# Patient Record
Sex: Female | Born: 1935 | Race: White | Hispanic: No | Smoking: Never smoker
Health system: Southern US, Community
[De-identification: ages and names within clinical notes are randomized; demographics above are authoritative.]

## PROBLEM LIST (undated history)

## (undated) DIAGNOSIS — J189 Pneumonia, unspecified organism: Secondary | ICD-10-CM

## (undated) DIAGNOSIS — Z85828 Personal history of other malignant neoplasm of skin: Secondary | ICD-10-CM

## (undated) DIAGNOSIS — Z789 Other specified health status: Secondary | ICD-10-CM

## (undated) DIAGNOSIS — R42 Dizziness and giddiness: Secondary | ICD-10-CM

## (undated) DIAGNOSIS — G589 Mononeuropathy, unspecified: Secondary | ICD-10-CM

## (undated) HISTORY — PX: VARICOSE VEIN SURGERY: SHX832

## (undated) HISTORY — PX: TONSILLECTOMY: SUR1361

## (undated) HISTORY — PX: BREAST LUMPECTOMY: SHX2

## (undated) HISTORY — PX: FINGER SURGERY: SHX640

## (undated) HISTORY — DX: Pneumonia, unspecified organism: J18.9

## (undated) HISTORY — PX: SKIN CANCER EXCISION: SHX779

## (undated) HISTORY — PX: BREAST SURGERY: SHX581

## (undated) HISTORY — PX: TOE SURGERY: SHX1073

## (undated) HISTORY — DX: Dizziness and giddiness: R42

## (undated) HISTORY — PX: CHOLECYSTECTOMY: SHX55

## (undated) HISTORY — PX: OTHER SURGICAL HISTORY: SHX169

## (undated) HISTORY — PX: CATARACT EXTRACTION, BILATERAL: SHX1313

## (undated) HISTORY — DX: Mononeuropathy, unspecified: G58.9

## (undated) HISTORY — PX: HAND SURGERY: SHX662

## (undated) HISTORY — DX: Other specified health status: Z78.9

---

## 2011-02-17 DIAGNOSIS — Z09 Encounter for follow-up examination after completed treatment for conditions other than malignant neoplasm: Secondary | ICD-10-CM | POA: Diagnosis not present

## 2011-02-17 DIAGNOSIS — H5210 Myopia, unspecified eye: Secondary | ICD-10-CM | POA: Diagnosis not present

## 2011-04-28 DIAGNOSIS — M999 Biomechanical lesion, unspecified: Secondary | ICD-10-CM | POA: Diagnosis not present

## 2011-05-02 DIAGNOSIS — M999 Biomechanical lesion, unspecified: Secondary | ICD-10-CM | POA: Diagnosis not present

## 2011-05-04 DIAGNOSIS — M999 Biomechanical lesion, unspecified: Secondary | ICD-10-CM | POA: Diagnosis not present

## 2011-05-05 DIAGNOSIS — M999 Biomechanical lesion, unspecified: Secondary | ICD-10-CM | POA: Diagnosis not present

## 2011-05-09 DIAGNOSIS — M999 Biomechanical lesion, unspecified: Secondary | ICD-10-CM | POA: Diagnosis not present

## 2011-05-11 DIAGNOSIS — M999 Biomechanical lesion, unspecified: Secondary | ICD-10-CM | POA: Diagnosis not present

## 2011-05-12 DIAGNOSIS — M999 Biomechanical lesion, unspecified: Secondary | ICD-10-CM | POA: Diagnosis not present

## 2011-05-16 DIAGNOSIS — M999 Biomechanical lesion, unspecified: Secondary | ICD-10-CM | POA: Diagnosis not present

## 2011-05-19 DIAGNOSIS — M999 Biomechanical lesion, unspecified: Secondary | ICD-10-CM | POA: Diagnosis not present

## 2011-05-23 DIAGNOSIS — M999 Biomechanical lesion, unspecified: Secondary | ICD-10-CM | POA: Diagnosis not present

## 2011-05-26 DIAGNOSIS — M999 Biomechanical lesion, unspecified: Secondary | ICD-10-CM | POA: Diagnosis not present

## 2011-05-31 DIAGNOSIS — M999 Biomechanical lesion, unspecified: Secondary | ICD-10-CM | POA: Diagnosis not present

## 2011-06-07 DIAGNOSIS — E782 Mixed hyperlipidemia: Secondary | ICD-10-CM | POA: Diagnosis not present

## 2011-06-07 DIAGNOSIS — Z131 Encounter for screening for diabetes mellitus: Secondary | ICD-10-CM | POA: Diagnosis not present

## 2011-06-07 DIAGNOSIS — E039 Hypothyroidism, unspecified: Secondary | ICD-10-CM | POA: Diagnosis not present

## 2011-06-07 DIAGNOSIS — Z79899 Other long term (current) drug therapy: Secondary | ICD-10-CM | POA: Diagnosis not present

## 2011-06-07 DIAGNOSIS — R634 Abnormal weight loss: Secondary | ICD-10-CM | POA: Diagnosis not present

## 2011-06-07 DIAGNOSIS — M810 Age-related osteoporosis without current pathological fracture: Secondary | ICD-10-CM | POA: Diagnosis not present

## 2011-06-14 DIAGNOSIS — M999 Biomechanical lesion, unspecified: Secondary | ICD-10-CM | POA: Diagnosis not present

## 2011-06-28 DIAGNOSIS — M999 Biomechanical lesion, unspecified: Secondary | ICD-10-CM | POA: Diagnosis not present

## 2011-07-19 DIAGNOSIS — M999 Biomechanical lesion, unspecified: Secondary | ICD-10-CM | POA: Diagnosis not present

## 2011-08-02 DIAGNOSIS — Z1231 Encounter for screening mammogram for malignant neoplasm of breast: Secondary | ICD-10-CM | POA: Diagnosis not present

## 2011-08-18 DIAGNOSIS — H26499 Other secondary cataract, unspecified eye: Secondary | ICD-10-CM | POA: Diagnosis not present

## 2011-09-10 DIAGNOSIS — L259 Unspecified contact dermatitis, unspecified cause: Secondary | ICD-10-CM | POA: Diagnosis not present

## 2011-09-10 DIAGNOSIS — W57XXXA Bitten or stung by nonvenomous insect and other nonvenomous arthropods, initial encounter: Secondary | ICD-10-CM | POA: Diagnosis not present

## 2011-09-10 DIAGNOSIS — T148 Other injury of unspecified body region: Secondary | ICD-10-CM | POA: Diagnosis not present

## 2011-10-10 DIAGNOSIS — L821 Other seborrheic keratosis: Secondary | ICD-10-CM | POA: Diagnosis not present

## 2011-10-10 DIAGNOSIS — L259 Unspecified contact dermatitis, unspecified cause: Secondary | ICD-10-CM | POA: Diagnosis not present

## 2011-10-27 DIAGNOSIS — S0003XA Contusion of scalp, initial encounter: Secondary | ICD-10-CM | POA: Diagnosis not present

## 2011-10-27 DIAGNOSIS — R51 Headache: Secondary | ICD-10-CM | POA: Diagnosis not present

## 2011-10-27 DIAGNOSIS — W1809XA Striking against other object with subsequent fall, initial encounter: Secondary | ICD-10-CM | POA: Diagnosis not present

## 2011-10-27 DIAGNOSIS — S1093XA Contusion of unspecified part of neck, initial encounter: Secondary | ICD-10-CM | POA: Diagnosis not present

## 2011-10-27 DIAGNOSIS — S0083XA Contusion of other part of head, initial encounter: Secondary | ICD-10-CM | POA: Diagnosis not present

## 2011-11-01 DIAGNOSIS — R634 Abnormal weight loss: Secondary | ICD-10-CM | POA: Diagnosis not present

## 2011-11-01 DIAGNOSIS — Z23 Encounter for immunization: Secondary | ICD-10-CM | POA: Diagnosis not present

## 2011-11-01 DIAGNOSIS — S0083XA Contusion of other part of head, initial encounter: Secondary | ICD-10-CM | POA: Diagnosis not present

## 2011-11-01 DIAGNOSIS — S1093XA Contusion of unspecified part of neck, initial encounter: Secondary | ICD-10-CM | POA: Diagnosis not present

## 2011-11-01 DIAGNOSIS — M9981 Other biomechanical lesions of cervical region: Secondary | ICD-10-CM | POA: Diagnosis not present

## 2011-11-01 DIAGNOSIS — S139XXA Sprain of joints and ligaments of unspecified parts of neck, initial encounter: Secondary | ICD-10-CM | POA: Diagnosis not present

## 2011-11-01 DIAGNOSIS — M999 Biomechanical lesion, unspecified: Secondary | ICD-10-CM | POA: Diagnosis not present

## 2011-11-07 DIAGNOSIS — M9981 Other biomechanical lesions of cervical region: Secondary | ICD-10-CM | POA: Diagnosis not present

## 2011-11-07 DIAGNOSIS — M999 Biomechanical lesion, unspecified: Secondary | ICD-10-CM | POA: Diagnosis not present

## 2011-11-07 DIAGNOSIS — S139XXA Sprain of joints and ligaments of unspecified parts of neck, initial encounter: Secondary | ICD-10-CM | POA: Diagnosis not present

## 2011-11-09 DIAGNOSIS — M9981 Other biomechanical lesions of cervical region: Secondary | ICD-10-CM | POA: Diagnosis not present

## 2011-11-09 DIAGNOSIS — M999 Biomechanical lesion, unspecified: Secondary | ICD-10-CM | POA: Diagnosis not present

## 2011-11-09 DIAGNOSIS — S139XXA Sprain of joints and ligaments of unspecified parts of neck, initial encounter: Secondary | ICD-10-CM | POA: Diagnosis not present

## 2011-11-10 DIAGNOSIS — S139XXA Sprain of joints and ligaments of unspecified parts of neck, initial encounter: Secondary | ICD-10-CM | POA: Diagnosis not present

## 2011-11-10 DIAGNOSIS — M999 Biomechanical lesion, unspecified: Secondary | ICD-10-CM | POA: Diagnosis not present

## 2011-11-10 DIAGNOSIS — M9981 Other biomechanical lesions of cervical region: Secondary | ICD-10-CM | POA: Diagnosis not present

## 2011-11-14 DIAGNOSIS — M9981 Other biomechanical lesions of cervical region: Secondary | ICD-10-CM | POA: Diagnosis not present

## 2011-11-14 DIAGNOSIS — M999 Biomechanical lesion, unspecified: Secondary | ICD-10-CM | POA: Diagnosis not present

## 2011-11-14 DIAGNOSIS — S139XXA Sprain of joints and ligaments of unspecified parts of neck, initial encounter: Secondary | ICD-10-CM | POA: Diagnosis not present

## 2011-11-15 DIAGNOSIS — M999 Biomechanical lesion, unspecified: Secondary | ICD-10-CM | POA: Diagnosis not present

## 2011-11-15 DIAGNOSIS — M9981 Other biomechanical lesions of cervical region: Secondary | ICD-10-CM | POA: Diagnosis not present

## 2011-11-15 DIAGNOSIS — S139XXA Sprain of joints and ligaments of unspecified parts of neck, initial encounter: Secondary | ICD-10-CM | POA: Diagnosis not present

## 2011-11-17 DIAGNOSIS — M999 Biomechanical lesion, unspecified: Secondary | ICD-10-CM | POA: Diagnosis not present

## 2011-11-17 DIAGNOSIS — M9981 Other biomechanical lesions of cervical region: Secondary | ICD-10-CM | POA: Diagnosis not present

## 2011-11-17 DIAGNOSIS — S139XXA Sprain of joints and ligaments of unspecified parts of neck, initial encounter: Secondary | ICD-10-CM | POA: Diagnosis not present

## 2011-11-21 DIAGNOSIS — M9981 Other biomechanical lesions of cervical region: Secondary | ICD-10-CM | POA: Diagnosis not present

## 2011-11-21 DIAGNOSIS — S139XXA Sprain of joints and ligaments of unspecified parts of neck, initial encounter: Secondary | ICD-10-CM | POA: Diagnosis not present

## 2011-11-21 DIAGNOSIS — M999 Biomechanical lesion, unspecified: Secondary | ICD-10-CM | POA: Diagnosis not present

## 2011-11-22 DIAGNOSIS — S139XXA Sprain of joints and ligaments of unspecified parts of neck, initial encounter: Secondary | ICD-10-CM | POA: Diagnosis not present

## 2011-11-22 DIAGNOSIS — M999 Biomechanical lesion, unspecified: Secondary | ICD-10-CM | POA: Diagnosis not present

## 2011-11-22 DIAGNOSIS — M9981 Other biomechanical lesions of cervical region: Secondary | ICD-10-CM | POA: Diagnosis not present

## 2011-11-24 DIAGNOSIS — M999 Biomechanical lesion, unspecified: Secondary | ICD-10-CM | POA: Diagnosis not present

## 2011-11-24 DIAGNOSIS — M9981 Other biomechanical lesions of cervical region: Secondary | ICD-10-CM | POA: Diagnosis not present

## 2011-11-24 DIAGNOSIS — S139XXA Sprain of joints and ligaments of unspecified parts of neck, initial encounter: Secondary | ICD-10-CM | POA: Diagnosis not present

## 2011-11-28 DIAGNOSIS — S139XXA Sprain of joints and ligaments of unspecified parts of neck, initial encounter: Secondary | ICD-10-CM | POA: Diagnosis not present

## 2011-11-28 DIAGNOSIS — M999 Biomechanical lesion, unspecified: Secondary | ICD-10-CM | POA: Diagnosis not present

## 2011-11-28 DIAGNOSIS — M9981 Other biomechanical lesions of cervical region: Secondary | ICD-10-CM | POA: Diagnosis not present

## 2011-12-01 DIAGNOSIS — M9981 Other biomechanical lesions of cervical region: Secondary | ICD-10-CM | POA: Diagnosis not present

## 2011-12-01 DIAGNOSIS — M999 Biomechanical lesion, unspecified: Secondary | ICD-10-CM | POA: Diagnosis not present

## 2011-12-01 DIAGNOSIS — S139XXA Sprain of joints and ligaments of unspecified parts of neck, initial encounter: Secondary | ICD-10-CM | POA: Diagnosis not present

## 2011-12-06 DIAGNOSIS — M999 Biomechanical lesion, unspecified: Secondary | ICD-10-CM | POA: Diagnosis not present

## 2011-12-06 DIAGNOSIS — S139XXA Sprain of joints and ligaments of unspecified parts of neck, initial encounter: Secondary | ICD-10-CM | POA: Diagnosis not present

## 2011-12-06 DIAGNOSIS — M9981 Other biomechanical lesions of cervical region: Secondary | ICD-10-CM | POA: Diagnosis not present

## 2011-12-08 DIAGNOSIS — M9981 Other biomechanical lesions of cervical region: Secondary | ICD-10-CM | POA: Diagnosis not present

## 2011-12-08 DIAGNOSIS — S139XXA Sprain of joints and ligaments of unspecified parts of neck, initial encounter: Secondary | ICD-10-CM | POA: Diagnosis not present

## 2011-12-08 DIAGNOSIS — M999 Biomechanical lesion, unspecified: Secondary | ICD-10-CM | POA: Diagnosis not present

## 2011-12-13 DIAGNOSIS — M9981 Other biomechanical lesions of cervical region: Secondary | ICD-10-CM | POA: Diagnosis not present

## 2011-12-13 DIAGNOSIS — M999 Biomechanical lesion, unspecified: Secondary | ICD-10-CM | POA: Diagnosis not present

## 2011-12-13 DIAGNOSIS — S139XXA Sprain of joints and ligaments of unspecified parts of neck, initial encounter: Secondary | ICD-10-CM | POA: Diagnosis not present

## 2011-12-27 DIAGNOSIS — M999 Biomechanical lesion, unspecified: Secondary | ICD-10-CM | POA: Diagnosis not present

## 2011-12-27 DIAGNOSIS — S139XXA Sprain of joints and ligaments of unspecified parts of neck, initial encounter: Secondary | ICD-10-CM | POA: Diagnosis not present

## 2011-12-27 DIAGNOSIS — M9981 Other biomechanical lesions of cervical region: Secondary | ICD-10-CM | POA: Diagnosis not present

## 2012-07-09 DIAGNOSIS — T148 Other injury of unspecified body region: Secondary | ICD-10-CM | POA: Diagnosis not present

## 2012-07-09 DIAGNOSIS — M25549 Pain in joints of unspecified hand: Secondary | ICD-10-CM | POA: Diagnosis not present

## 2012-07-09 DIAGNOSIS — Z0000 Encounter for general adult medical examination without abnormal findings: Secondary | ICD-10-CM | POA: Diagnosis not present

## 2012-08-20 DIAGNOSIS — H524 Presbyopia: Secondary | ICD-10-CM | POA: Diagnosis not present

## 2012-08-20 DIAGNOSIS — Z961 Presence of intraocular lens: Secondary | ICD-10-CM | POA: Diagnosis not present

## 2012-08-20 DIAGNOSIS — H26499 Other secondary cataract, unspecified eye: Secondary | ICD-10-CM | POA: Diagnosis not present

## 2012-09-07 DIAGNOSIS — R894 Abnormal immunological findings in specimens from other organs, systems and tissues: Secondary | ICD-10-CM | POA: Diagnosis not present

## 2012-11-20 DIAGNOSIS — Z23 Encounter for immunization: Secondary | ICD-10-CM | POA: Diagnosis not present

## 2013-02-21 DIAGNOSIS — H26499 Other secondary cataract, unspecified eye: Secondary | ICD-10-CM | POA: Diagnosis not present

## 2013-02-27 DIAGNOSIS — H26499 Other secondary cataract, unspecified eye: Secondary | ICD-10-CM | POA: Diagnosis not present

## 2013-06-03 DIAGNOSIS — A938 Other specified arthropod-borne viral fevers: Secondary | ICD-10-CM | POA: Diagnosis not present

## 2013-06-18 DIAGNOSIS — M810 Age-related osteoporosis without current pathological fracture: Secondary | ICD-10-CM | POA: Diagnosis not present

## 2013-06-18 DIAGNOSIS — M1990 Unspecified osteoarthritis, unspecified site: Secondary | ICD-10-CM | POA: Diagnosis not present

## 2013-06-18 DIAGNOSIS — Z131 Encounter for screening for diabetes mellitus: Secondary | ICD-10-CM | POA: Diagnosis not present

## 2013-06-18 DIAGNOSIS — K219 Gastro-esophageal reflux disease without esophagitis: Secondary | ICD-10-CM | POA: Diagnosis not present

## 2013-06-18 DIAGNOSIS — E039 Hypothyroidism, unspecified: Secondary | ICD-10-CM | POA: Diagnosis not present

## 2013-06-18 DIAGNOSIS — Z13220 Encounter for screening for lipoid disorders: Secondary | ICD-10-CM | POA: Diagnosis not present

## 2013-06-18 DIAGNOSIS — Z780 Asymptomatic menopausal state: Secondary | ICD-10-CM | POA: Diagnosis not present

## 2013-06-18 DIAGNOSIS — E559 Vitamin D deficiency, unspecified: Secondary | ICD-10-CM | POA: Diagnosis not present

## 2013-06-18 DIAGNOSIS — Z79899 Other long term (current) drug therapy: Secondary | ICD-10-CM | POA: Diagnosis not present

## 2013-06-18 DIAGNOSIS — D508 Other iron deficiency anemias: Secondary | ICD-10-CM | POA: Diagnosis not present

## 2013-06-24 DIAGNOSIS — Z1231 Encounter for screening mammogram for malignant neoplasm of breast: Secondary | ICD-10-CM | POA: Diagnosis not present

## 2013-07-17 DIAGNOSIS — R922 Inconclusive mammogram: Secondary | ICD-10-CM | POA: Diagnosis not present

## 2013-09-16 DIAGNOSIS — H26499 Other secondary cataract, unspecified eye: Secondary | ICD-10-CM | POA: Diagnosis not present

## 2013-09-24 DIAGNOSIS — R21 Rash and other nonspecific skin eruption: Secondary | ICD-10-CM | POA: Diagnosis not present

## 2013-10-04 DIAGNOSIS — W57XXXA Bitten or stung by nonvenomous insect and other nonvenomous arthropods, initial encounter: Secondary | ICD-10-CM | POA: Diagnosis not present

## 2013-10-04 DIAGNOSIS — T148 Other injury of unspecified body region: Secondary | ICD-10-CM | POA: Diagnosis not present

## 2013-10-10 DIAGNOSIS — H26499 Other secondary cataract, unspecified eye: Secondary | ICD-10-CM | POA: Diagnosis not present

## 2013-10-14 DIAGNOSIS — D239 Other benign neoplasm of skin, unspecified: Secondary | ICD-10-CM | POA: Diagnosis not present

## 2013-10-14 DIAGNOSIS — D1801 Hemangioma of skin and subcutaneous tissue: Secondary | ICD-10-CM | POA: Diagnosis not present

## 2013-10-14 DIAGNOSIS — L570 Actinic keratosis: Secondary | ICD-10-CM | POA: Diagnosis not present

## 2013-10-18 DIAGNOSIS — I889 Nonspecific lymphadenitis, unspecified: Secondary | ICD-10-CM | POA: Diagnosis not present

## 2013-10-23 DIAGNOSIS — I889 Nonspecific lymphadenitis, unspecified: Secondary | ICD-10-CM | POA: Diagnosis not present

## 2013-10-23 DIAGNOSIS — N63 Unspecified lump in unspecified breast: Secondary | ICD-10-CM | POA: Diagnosis not present

## 2013-11-12 DIAGNOSIS — Z23 Encounter for immunization: Secondary | ICD-10-CM | POA: Diagnosis not present

## 2014-04-30 DIAGNOSIS — H26493 Other secondary cataract, bilateral: Secondary | ICD-10-CM | POA: Diagnosis not present

## 2014-06-30 DIAGNOSIS — H60502 Unspecified acute noninfective otitis externa, left ear: Secondary | ICD-10-CM | POA: Diagnosis not present

## 2014-06-30 DIAGNOSIS — H6123 Impacted cerumen, bilateral: Secondary | ICD-10-CM | POA: Diagnosis not present

## 2014-06-30 DIAGNOSIS — J0100 Acute maxillary sinusitis, unspecified: Secondary | ICD-10-CM | POA: Diagnosis not present

## 2014-07-18 DIAGNOSIS — H938X2 Other specified disorders of left ear: Secondary | ICD-10-CM | POA: Diagnosis not present

## 2014-07-18 DIAGNOSIS — H6982 Other specified disorders of Eustachian tube, left ear: Secondary | ICD-10-CM | POA: Diagnosis not present

## 2014-08-19 DIAGNOSIS — Z1231 Encounter for screening mammogram for malignant neoplasm of breast: Secondary | ICD-10-CM | POA: Diagnosis not present

## 2014-08-25 DIAGNOSIS — Z803 Family history of malignant neoplasm of breast: Secondary | ICD-10-CM | POA: Diagnosis not present

## 2014-08-25 DIAGNOSIS — R922 Inconclusive mammogram: Secondary | ICD-10-CM | POA: Diagnosis not present

## 2014-11-27 DIAGNOSIS — Z23 Encounter for immunization: Secondary | ICD-10-CM | POA: Diagnosis not present

## 2014-12-24 DIAGNOSIS — M1990 Unspecified osteoarthritis, unspecified site: Secondary | ICD-10-CM | POA: Diagnosis not present

## 2014-12-24 DIAGNOSIS — Z0000 Encounter for general adult medical examination without abnormal findings: Secondary | ICD-10-CM | POA: Diagnosis not present

## 2014-12-24 DIAGNOSIS — Z23 Encounter for immunization: Secondary | ICD-10-CM | POA: Diagnosis not present

## 2014-12-24 DIAGNOSIS — E039 Hypothyroidism, unspecified: Secondary | ICD-10-CM | POA: Diagnosis not present

## 2014-12-24 DIAGNOSIS — M810 Age-related osteoporosis without current pathological fracture: Secondary | ICD-10-CM | POA: Diagnosis not present

## 2014-12-24 DIAGNOSIS — E559 Vitamin D deficiency, unspecified: Secondary | ICD-10-CM | POA: Diagnosis not present

## 2014-12-24 DIAGNOSIS — Z79899 Other long term (current) drug therapy: Secondary | ICD-10-CM | POA: Diagnosis not present

## 2014-12-24 DIAGNOSIS — E782 Mixed hyperlipidemia: Secondary | ICD-10-CM | POA: Diagnosis not present

## 2014-12-24 DIAGNOSIS — K219 Gastro-esophageal reflux disease without esophagitis: Secondary | ICD-10-CM | POA: Diagnosis not present

## 2014-12-29 DIAGNOSIS — R3129 Other microscopic hematuria: Secondary | ICD-10-CM | POA: Diagnosis not present

## 2014-12-29 DIAGNOSIS — R319 Hematuria, unspecified: Secondary | ICD-10-CM | POA: Diagnosis not present

## 2015-04-23 DIAGNOSIS — M9901 Segmental and somatic dysfunction of cervical region: Secondary | ICD-10-CM | POA: Diagnosis not present

## 2015-04-23 DIAGNOSIS — M5137 Other intervertebral disc degeneration, lumbosacral region: Secondary | ICD-10-CM | POA: Diagnosis not present

## 2015-04-23 DIAGNOSIS — M5412 Radiculopathy, cervical region: Secondary | ICD-10-CM | POA: Diagnosis not present

## 2015-04-23 DIAGNOSIS — M5442 Lumbago with sciatica, left side: Secondary | ICD-10-CM | POA: Diagnosis not present

## 2015-04-23 DIAGNOSIS — M9903 Segmental and somatic dysfunction of lumbar region: Secondary | ICD-10-CM | POA: Diagnosis not present

## 2015-04-23 DIAGNOSIS — M5136 Other intervertebral disc degeneration, lumbar region: Secondary | ICD-10-CM | POA: Diagnosis not present

## 2015-04-23 DIAGNOSIS — M9904 Segmental and somatic dysfunction of sacral region: Secondary | ICD-10-CM | POA: Diagnosis not present

## 2015-04-23 DIAGNOSIS — M50323 Other cervical disc degeneration at C6-C7 level: Secondary | ICD-10-CM | POA: Diagnosis not present

## 2015-04-27 DIAGNOSIS — M5136 Other intervertebral disc degeneration, lumbar region: Secondary | ICD-10-CM | POA: Diagnosis not present

## 2015-04-27 DIAGNOSIS — M9901 Segmental and somatic dysfunction of cervical region: Secondary | ICD-10-CM | POA: Diagnosis not present

## 2015-04-27 DIAGNOSIS — M5137 Other intervertebral disc degeneration, lumbosacral region: Secondary | ICD-10-CM | POA: Diagnosis not present

## 2015-04-27 DIAGNOSIS — M50323 Other cervical disc degeneration at C6-C7 level: Secondary | ICD-10-CM | POA: Diagnosis not present

## 2015-04-27 DIAGNOSIS — M9903 Segmental and somatic dysfunction of lumbar region: Secondary | ICD-10-CM | POA: Diagnosis not present

## 2015-04-27 DIAGNOSIS — M9904 Segmental and somatic dysfunction of sacral region: Secondary | ICD-10-CM | POA: Diagnosis not present

## 2015-04-27 DIAGNOSIS — M5412 Radiculopathy, cervical region: Secondary | ICD-10-CM | POA: Diagnosis not present

## 2015-04-27 DIAGNOSIS — M5442 Lumbago with sciatica, left side: Secondary | ICD-10-CM | POA: Diagnosis not present

## 2015-05-01 DIAGNOSIS — M9901 Segmental and somatic dysfunction of cervical region: Secondary | ICD-10-CM | POA: Diagnosis not present

## 2015-05-01 DIAGNOSIS — M50323 Other cervical disc degeneration at C6-C7 level: Secondary | ICD-10-CM | POA: Diagnosis not present

## 2015-05-01 DIAGNOSIS — M9903 Segmental and somatic dysfunction of lumbar region: Secondary | ICD-10-CM | POA: Diagnosis not present

## 2015-05-01 DIAGNOSIS — M5137 Other intervertebral disc degeneration, lumbosacral region: Secondary | ICD-10-CM | POA: Diagnosis not present

## 2015-05-01 DIAGNOSIS — M5412 Radiculopathy, cervical region: Secondary | ICD-10-CM | POA: Diagnosis not present

## 2015-05-01 DIAGNOSIS — M5136 Other intervertebral disc degeneration, lumbar region: Secondary | ICD-10-CM | POA: Diagnosis not present

## 2015-05-01 DIAGNOSIS — M9904 Segmental and somatic dysfunction of sacral region: Secondary | ICD-10-CM | POA: Diagnosis not present

## 2015-05-01 DIAGNOSIS — M5442 Lumbago with sciatica, left side: Secondary | ICD-10-CM | POA: Diagnosis not present

## 2015-05-04 DIAGNOSIS — M50323 Other cervical disc degeneration at C6-C7 level: Secondary | ICD-10-CM | POA: Diagnosis not present

## 2015-05-04 DIAGNOSIS — M5136 Other intervertebral disc degeneration, lumbar region: Secondary | ICD-10-CM | POA: Diagnosis not present

## 2015-05-04 DIAGNOSIS — M9901 Segmental and somatic dysfunction of cervical region: Secondary | ICD-10-CM | POA: Diagnosis not present

## 2015-05-04 DIAGNOSIS — M5137 Other intervertebral disc degeneration, lumbosacral region: Secondary | ICD-10-CM | POA: Diagnosis not present

## 2015-05-04 DIAGNOSIS — M5412 Radiculopathy, cervical region: Secondary | ICD-10-CM | POA: Diagnosis not present

## 2015-05-04 DIAGNOSIS — M9903 Segmental and somatic dysfunction of lumbar region: Secondary | ICD-10-CM | POA: Diagnosis not present

## 2015-05-04 DIAGNOSIS — M9904 Segmental and somatic dysfunction of sacral region: Secondary | ICD-10-CM | POA: Diagnosis not present

## 2015-05-04 DIAGNOSIS — M5442 Lumbago with sciatica, left side: Secondary | ICD-10-CM | POA: Diagnosis not present

## 2015-05-05 DIAGNOSIS — H26493 Other secondary cataract, bilateral: Secondary | ICD-10-CM | POA: Diagnosis not present

## 2015-05-06 DIAGNOSIS — M5137 Other intervertebral disc degeneration, lumbosacral region: Secondary | ICD-10-CM | POA: Diagnosis not present

## 2015-05-06 DIAGNOSIS — M5412 Radiculopathy, cervical region: Secondary | ICD-10-CM | POA: Diagnosis not present

## 2015-05-06 DIAGNOSIS — M9904 Segmental and somatic dysfunction of sacral region: Secondary | ICD-10-CM | POA: Diagnosis not present

## 2015-05-06 DIAGNOSIS — M50323 Other cervical disc degeneration at C6-C7 level: Secondary | ICD-10-CM | POA: Diagnosis not present

## 2015-05-06 DIAGNOSIS — M9901 Segmental and somatic dysfunction of cervical region: Secondary | ICD-10-CM | POA: Diagnosis not present

## 2015-05-06 DIAGNOSIS — M5136 Other intervertebral disc degeneration, lumbar region: Secondary | ICD-10-CM | POA: Diagnosis not present

## 2015-05-06 DIAGNOSIS — M9903 Segmental and somatic dysfunction of lumbar region: Secondary | ICD-10-CM | POA: Diagnosis not present

## 2015-05-06 DIAGNOSIS — M5442 Lumbago with sciatica, left side: Secondary | ICD-10-CM | POA: Diagnosis not present

## 2015-05-07 DIAGNOSIS — M9904 Segmental and somatic dysfunction of sacral region: Secondary | ICD-10-CM | POA: Diagnosis not present

## 2015-05-07 DIAGNOSIS — M9903 Segmental and somatic dysfunction of lumbar region: Secondary | ICD-10-CM | POA: Diagnosis not present

## 2015-05-07 DIAGNOSIS — M5442 Lumbago with sciatica, left side: Secondary | ICD-10-CM | POA: Diagnosis not present

## 2015-05-07 DIAGNOSIS — M5137 Other intervertebral disc degeneration, lumbosacral region: Secondary | ICD-10-CM | POA: Diagnosis not present

## 2015-05-07 DIAGNOSIS — M50323 Other cervical disc degeneration at C6-C7 level: Secondary | ICD-10-CM | POA: Diagnosis not present

## 2015-05-07 DIAGNOSIS — M5412 Radiculopathy, cervical region: Secondary | ICD-10-CM | POA: Diagnosis not present

## 2015-05-07 DIAGNOSIS — M9901 Segmental and somatic dysfunction of cervical region: Secondary | ICD-10-CM | POA: Diagnosis not present

## 2015-05-07 DIAGNOSIS — M5136 Other intervertebral disc degeneration, lumbar region: Secondary | ICD-10-CM | POA: Diagnosis not present

## 2015-05-11 DIAGNOSIS — M5442 Lumbago with sciatica, left side: Secondary | ICD-10-CM | POA: Diagnosis not present

## 2015-05-11 DIAGNOSIS — M5412 Radiculopathy, cervical region: Secondary | ICD-10-CM | POA: Diagnosis not present

## 2015-05-11 DIAGNOSIS — M50323 Other cervical disc degeneration at C6-C7 level: Secondary | ICD-10-CM | POA: Diagnosis not present

## 2015-05-11 DIAGNOSIS — M9901 Segmental and somatic dysfunction of cervical region: Secondary | ICD-10-CM | POA: Diagnosis not present

## 2015-05-11 DIAGNOSIS — M9904 Segmental and somatic dysfunction of sacral region: Secondary | ICD-10-CM | POA: Diagnosis not present

## 2015-05-11 DIAGNOSIS — M9903 Segmental and somatic dysfunction of lumbar region: Secondary | ICD-10-CM | POA: Diagnosis not present

## 2015-05-11 DIAGNOSIS — M5136 Other intervertebral disc degeneration, lumbar region: Secondary | ICD-10-CM | POA: Diagnosis not present

## 2015-05-11 DIAGNOSIS — M5137 Other intervertebral disc degeneration, lumbosacral region: Secondary | ICD-10-CM | POA: Diagnosis not present

## 2015-05-13 DIAGNOSIS — M50323 Other cervical disc degeneration at C6-C7 level: Secondary | ICD-10-CM | POA: Diagnosis not present

## 2015-05-13 DIAGNOSIS — M9903 Segmental and somatic dysfunction of lumbar region: Secondary | ICD-10-CM | POA: Diagnosis not present

## 2015-05-13 DIAGNOSIS — M5412 Radiculopathy, cervical region: Secondary | ICD-10-CM | POA: Diagnosis not present

## 2015-05-13 DIAGNOSIS — M5136 Other intervertebral disc degeneration, lumbar region: Secondary | ICD-10-CM | POA: Diagnosis not present

## 2015-05-13 DIAGNOSIS — M5442 Lumbago with sciatica, left side: Secondary | ICD-10-CM | POA: Diagnosis not present

## 2015-05-13 DIAGNOSIS — M9901 Segmental and somatic dysfunction of cervical region: Secondary | ICD-10-CM | POA: Diagnosis not present

## 2015-05-13 DIAGNOSIS — M9904 Segmental and somatic dysfunction of sacral region: Secondary | ICD-10-CM | POA: Diagnosis not present

## 2015-05-13 DIAGNOSIS — M5137 Other intervertebral disc degeneration, lumbosacral region: Secondary | ICD-10-CM | POA: Diagnosis not present

## 2015-05-14 DIAGNOSIS — M9904 Segmental and somatic dysfunction of sacral region: Secondary | ICD-10-CM | POA: Diagnosis not present

## 2015-05-14 DIAGNOSIS — M5412 Radiculopathy, cervical region: Secondary | ICD-10-CM | POA: Diagnosis not present

## 2015-05-14 DIAGNOSIS — M5136 Other intervertebral disc degeneration, lumbar region: Secondary | ICD-10-CM | POA: Diagnosis not present

## 2015-05-14 DIAGNOSIS — M9901 Segmental and somatic dysfunction of cervical region: Secondary | ICD-10-CM | POA: Diagnosis not present

## 2015-05-14 DIAGNOSIS — M5442 Lumbago with sciatica, left side: Secondary | ICD-10-CM | POA: Diagnosis not present

## 2015-05-14 DIAGNOSIS — M5137 Other intervertebral disc degeneration, lumbosacral region: Secondary | ICD-10-CM | POA: Diagnosis not present

## 2015-05-14 DIAGNOSIS — M9903 Segmental and somatic dysfunction of lumbar region: Secondary | ICD-10-CM | POA: Diagnosis not present

## 2015-05-14 DIAGNOSIS — M50323 Other cervical disc degeneration at C6-C7 level: Secondary | ICD-10-CM | POA: Diagnosis not present

## 2015-05-18 DIAGNOSIS — M9903 Segmental and somatic dysfunction of lumbar region: Secondary | ICD-10-CM | POA: Diagnosis not present

## 2015-05-18 DIAGNOSIS — M5412 Radiculopathy, cervical region: Secondary | ICD-10-CM | POA: Diagnosis not present

## 2015-05-18 DIAGNOSIS — M9901 Segmental and somatic dysfunction of cervical region: Secondary | ICD-10-CM | POA: Diagnosis not present

## 2015-05-18 DIAGNOSIS — M5442 Lumbago with sciatica, left side: Secondary | ICD-10-CM | POA: Diagnosis not present

## 2015-05-18 DIAGNOSIS — M50323 Other cervical disc degeneration at C6-C7 level: Secondary | ICD-10-CM | POA: Diagnosis not present

## 2015-05-18 DIAGNOSIS — M5137 Other intervertebral disc degeneration, lumbosacral region: Secondary | ICD-10-CM | POA: Diagnosis not present

## 2015-05-18 DIAGNOSIS — M5136 Other intervertebral disc degeneration, lumbar region: Secondary | ICD-10-CM | POA: Diagnosis not present

## 2015-05-18 DIAGNOSIS — M9904 Segmental and somatic dysfunction of sacral region: Secondary | ICD-10-CM | POA: Diagnosis not present

## 2015-05-20 DIAGNOSIS — M5442 Lumbago with sciatica, left side: Secondary | ICD-10-CM | POA: Diagnosis not present

## 2015-05-20 DIAGNOSIS — M5137 Other intervertebral disc degeneration, lumbosacral region: Secondary | ICD-10-CM | POA: Diagnosis not present

## 2015-05-20 DIAGNOSIS — M5136 Other intervertebral disc degeneration, lumbar region: Secondary | ICD-10-CM | POA: Diagnosis not present

## 2015-05-20 DIAGNOSIS — M5412 Radiculopathy, cervical region: Secondary | ICD-10-CM | POA: Diagnosis not present

## 2015-05-20 DIAGNOSIS — M9901 Segmental and somatic dysfunction of cervical region: Secondary | ICD-10-CM | POA: Diagnosis not present

## 2015-05-20 DIAGNOSIS — M9904 Segmental and somatic dysfunction of sacral region: Secondary | ICD-10-CM | POA: Diagnosis not present

## 2015-05-20 DIAGNOSIS — M50323 Other cervical disc degeneration at C6-C7 level: Secondary | ICD-10-CM | POA: Diagnosis not present

## 2015-05-20 DIAGNOSIS — M9903 Segmental and somatic dysfunction of lumbar region: Secondary | ICD-10-CM | POA: Diagnosis not present

## 2015-05-21 DIAGNOSIS — M50323 Other cervical disc degeneration at C6-C7 level: Secondary | ICD-10-CM | POA: Diagnosis not present

## 2015-05-21 DIAGNOSIS — M5412 Radiculopathy, cervical region: Secondary | ICD-10-CM | POA: Diagnosis not present

## 2015-05-21 DIAGNOSIS — M5442 Lumbago with sciatica, left side: Secondary | ICD-10-CM | POA: Diagnosis not present

## 2015-05-21 DIAGNOSIS — M5136 Other intervertebral disc degeneration, lumbar region: Secondary | ICD-10-CM | POA: Diagnosis not present

## 2015-05-21 DIAGNOSIS — M5137 Other intervertebral disc degeneration, lumbosacral region: Secondary | ICD-10-CM | POA: Diagnosis not present

## 2015-05-21 DIAGNOSIS — M9903 Segmental and somatic dysfunction of lumbar region: Secondary | ICD-10-CM | POA: Diagnosis not present

## 2015-05-21 DIAGNOSIS — M9901 Segmental and somatic dysfunction of cervical region: Secondary | ICD-10-CM | POA: Diagnosis not present

## 2015-05-21 DIAGNOSIS — M9904 Segmental and somatic dysfunction of sacral region: Secondary | ICD-10-CM | POA: Diagnosis not present

## 2015-05-26 DIAGNOSIS — M5137 Other intervertebral disc degeneration, lumbosacral region: Secondary | ICD-10-CM | POA: Diagnosis not present

## 2015-05-26 DIAGNOSIS — M5412 Radiculopathy, cervical region: Secondary | ICD-10-CM | POA: Diagnosis not present

## 2015-05-26 DIAGNOSIS — M9904 Segmental and somatic dysfunction of sacral region: Secondary | ICD-10-CM | POA: Diagnosis not present

## 2015-05-26 DIAGNOSIS — M9901 Segmental and somatic dysfunction of cervical region: Secondary | ICD-10-CM | POA: Diagnosis not present

## 2015-05-26 DIAGNOSIS — M5442 Lumbago with sciatica, left side: Secondary | ICD-10-CM | POA: Diagnosis not present

## 2015-05-26 DIAGNOSIS — M9903 Segmental and somatic dysfunction of lumbar region: Secondary | ICD-10-CM | POA: Diagnosis not present

## 2015-05-26 DIAGNOSIS — M50323 Other cervical disc degeneration at C6-C7 level: Secondary | ICD-10-CM | POA: Diagnosis not present

## 2015-05-26 DIAGNOSIS — M5136 Other intervertebral disc degeneration, lumbar region: Secondary | ICD-10-CM | POA: Diagnosis not present

## 2015-05-27 DIAGNOSIS — M50323 Other cervical disc degeneration at C6-C7 level: Secondary | ICD-10-CM | POA: Diagnosis not present

## 2015-05-27 DIAGNOSIS — M9903 Segmental and somatic dysfunction of lumbar region: Secondary | ICD-10-CM | POA: Diagnosis not present

## 2015-05-27 DIAGNOSIS — M5137 Other intervertebral disc degeneration, lumbosacral region: Secondary | ICD-10-CM | POA: Diagnosis not present

## 2015-05-27 DIAGNOSIS — M9901 Segmental and somatic dysfunction of cervical region: Secondary | ICD-10-CM | POA: Diagnosis not present

## 2015-05-27 DIAGNOSIS — M5136 Other intervertebral disc degeneration, lumbar region: Secondary | ICD-10-CM | POA: Diagnosis not present

## 2015-05-27 DIAGNOSIS — M5442 Lumbago with sciatica, left side: Secondary | ICD-10-CM | POA: Diagnosis not present

## 2015-05-27 DIAGNOSIS — M9904 Segmental and somatic dysfunction of sacral region: Secondary | ICD-10-CM | POA: Diagnosis not present

## 2015-05-27 DIAGNOSIS — M5412 Radiculopathy, cervical region: Secondary | ICD-10-CM | POA: Diagnosis not present

## 2015-05-28 DIAGNOSIS — M50323 Other cervical disc degeneration at C6-C7 level: Secondary | ICD-10-CM | POA: Diagnosis not present

## 2015-05-28 DIAGNOSIS — M9903 Segmental and somatic dysfunction of lumbar region: Secondary | ICD-10-CM | POA: Diagnosis not present

## 2015-05-28 DIAGNOSIS — M5137 Other intervertebral disc degeneration, lumbosacral region: Secondary | ICD-10-CM | POA: Diagnosis not present

## 2015-05-28 DIAGNOSIS — M9901 Segmental and somatic dysfunction of cervical region: Secondary | ICD-10-CM | POA: Diagnosis not present

## 2015-05-28 DIAGNOSIS — M5412 Radiculopathy, cervical region: Secondary | ICD-10-CM | POA: Diagnosis not present

## 2015-05-28 DIAGNOSIS — M5136 Other intervertebral disc degeneration, lumbar region: Secondary | ICD-10-CM | POA: Diagnosis not present

## 2015-05-28 DIAGNOSIS — M5442 Lumbago with sciatica, left side: Secondary | ICD-10-CM | POA: Diagnosis not present

## 2015-05-28 DIAGNOSIS — M9904 Segmental and somatic dysfunction of sacral region: Secondary | ICD-10-CM | POA: Diagnosis not present

## 2015-06-10 DIAGNOSIS — M5137 Other intervertebral disc degeneration, lumbosacral region: Secondary | ICD-10-CM | POA: Diagnosis not present

## 2015-06-10 DIAGNOSIS — M5442 Lumbago with sciatica, left side: Secondary | ICD-10-CM | POA: Diagnosis not present

## 2015-06-10 DIAGNOSIS — M9901 Segmental and somatic dysfunction of cervical region: Secondary | ICD-10-CM | POA: Diagnosis not present

## 2015-06-10 DIAGNOSIS — M50323 Other cervical disc degeneration at C6-C7 level: Secondary | ICD-10-CM | POA: Diagnosis not present

## 2015-06-10 DIAGNOSIS — M5412 Radiculopathy, cervical region: Secondary | ICD-10-CM | POA: Diagnosis not present

## 2015-06-10 DIAGNOSIS — M5136 Other intervertebral disc degeneration, lumbar region: Secondary | ICD-10-CM | POA: Diagnosis not present

## 2015-06-10 DIAGNOSIS — M9903 Segmental and somatic dysfunction of lumbar region: Secondary | ICD-10-CM | POA: Diagnosis not present

## 2015-06-10 DIAGNOSIS — M9904 Segmental and somatic dysfunction of sacral region: Secondary | ICD-10-CM | POA: Diagnosis not present

## 2015-06-25 DIAGNOSIS — K219 Gastro-esophageal reflux disease without esophagitis: Secondary | ICD-10-CM | POA: Diagnosis not present

## 2015-06-25 DIAGNOSIS — Z13820 Encounter for screening for osteoporosis: Secondary | ICD-10-CM | POA: Diagnosis not present

## 2015-06-25 DIAGNOSIS — E782 Mixed hyperlipidemia: Secondary | ICD-10-CM | POA: Diagnosis not present

## 2015-06-25 DIAGNOSIS — M1990 Unspecified osteoarthritis, unspecified site: Secondary | ICD-10-CM | POA: Diagnosis not present

## 2015-06-25 DIAGNOSIS — Z79899 Other long term (current) drug therapy: Secondary | ICD-10-CM | POA: Diagnosis not present

## 2015-06-25 DIAGNOSIS — E039 Hypothyroidism, unspecified: Secondary | ICD-10-CM | POA: Diagnosis not present

## 2015-08-17 DIAGNOSIS — R42 Dizziness and giddiness: Secondary | ICD-10-CM | POA: Diagnosis not present

## 2015-08-17 DIAGNOSIS — H832X9 Labyrinthine dysfunction, unspecified ear: Secondary | ICD-10-CM | POA: Diagnosis not present

## 2015-08-17 DIAGNOSIS — R531 Weakness: Secondary | ICD-10-CM | POA: Diagnosis not present

## 2015-08-21 DIAGNOSIS — R262 Difficulty in walking, not elsewhere classified: Secondary | ICD-10-CM | POA: Diagnosis not present

## 2015-08-21 DIAGNOSIS — H832X9 Labyrinthine dysfunction, unspecified ear: Secondary | ICD-10-CM | POA: Diagnosis not present

## 2015-08-28 DIAGNOSIS — H832X9 Labyrinthine dysfunction, unspecified ear: Secondary | ICD-10-CM | POA: Diagnosis not present

## 2015-08-28 DIAGNOSIS — R262 Difficulty in walking, not elsewhere classified: Secondary | ICD-10-CM | POA: Diagnosis not present

## 2015-09-22 ENCOUNTER — Other Ambulatory Visit: Payer: Self-pay

## 2015-10-05 DIAGNOSIS — Z6822 Body mass index (BMI) 22.0-22.9, adult: Secondary | ICD-10-CM | POA: Diagnosis not present

## 2015-10-05 DIAGNOSIS — H832X9 Labyrinthine dysfunction, unspecified ear: Secondary | ICD-10-CM | POA: Diagnosis not present

## 2015-10-05 DIAGNOSIS — M542 Cervicalgia: Secondary | ICD-10-CM | POA: Diagnosis not present

## 2015-10-05 DIAGNOSIS — R42 Dizziness and giddiness: Secondary | ICD-10-CM | POA: Diagnosis not present

## 2015-10-05 DIAGNOSIS — Z9181 History of falling: Secondary | ICD-10-CM | POA: Diagnosis not present

## 2015-11-09 DIAGNOSIS — Z1231 Encounter for screening mammogram for malignant neoplasm of breast: Secondary | ICD-10-CM | POA: Diagnosis not present

## 2015-11-20 DIAGNOSIS — Z23 Encounter for immunization: Secondary | ICD-10-CM | POA: Diagnosis not present

## 2016-03-16 DIAGNOSIS — L821 Other seborrheic keratosis: Secondary | ICD-10-CM | POA: Diagnosis not present

## 2016-03-16 DIAGNOSIS — L570 Actinic keratosis: Secondary | ICD-10-CM | POA: Diagnosis not present

## 2016-03-16 DIAGNOSIS — D1801 Hemangioma of skin and subcutaneous tissue: Secondary | ICD-10-CM | POA: Diagnosis not present

## 2016-05-03 DIAGNOSIS — K219 Gastro-esophageal reflux disease without esophagitis: Secondary | ICD-10-CM | POA: Diagnosis not present

## 2016-05-03 DIAGNOSIS — E039 Hypothyroidism, unspecified: Secondary | ICD-10-CM | POA: Diagnosis not present

## 2016-05-03 DIAGNOSIS — E782 Mixed hyperlipidemia: Secondary | ICD-10-CM | POA: Diagnosis not present

## 2016-05-03 DIAGNOSIS — M1990 Unspecified osteoarthritis, unspecified site: Secondary | ICD-10-CM | POA: Diagnosis not present

## 2016-05-03 DIAGNOSIS — Z79899 Other long term (current) drug therapy: Secondary | ICD-10-CM | POA: Diagnosis not present

## 2016-05-03 DIAGNOSIS — M810 Age-related osteoporosis without current pathological fracture: Secondary | ICD-10-CM | POA: Diagnosis not present

## 2016-05-03 DIAGNOSIS — Z0000 Encounter for general adult medical examination without abnormal findings: Secondary | ICD-10-CM | POA: Diagnosis not present

## 2016-05-09 DIAGNOSIS — H26493 Other secondary cataract, bilateral: Secondary | ICD-10-CM | POA: Diagnosis not present

## 2016-08-25 DIAGNOSIS — M9902 Segmental and somatic dysfunction of thoracic region: Secondary | ICD-10-CM | POA: Diagnosis not present

## 2016-08-25 DIAGNOSIS — M5136 Other intervertebral disc degeneration, lumbar region: Secondary | ICD-10-CM | POA: Diagnosis not present

## 2016-08-25 DIAGNOSIS — M5134 Other intervertebral disc degeneration, thoracic region: Secondary | ICD-10-CM | POA: Diagnosis not present

## 2016-08-25 DIAGNOSIS — M9903 Segmental and somatic dysfunction of lumbar region: Secondary | ICD-10-CM | POA: Diagnosis not present

## 2016-08-29 DIAGNOSIS — M5134 Other intervertebral disc degeneration, thoracic region: Secondary | ICD-10-CM | POA: Diagnosis not present

## 2016-08-29 DIAGNOSIS — M5136 Other intervertebral disc degeneration, lumbar region: Secondary | ICD-10-CM | POA: Diagnosis not present

## 2016-08-29 DIAGNOSIS — M9903 Segmental and somatic dysfunction of lumbar region: Secondary | ICD-10-CM | POA: Diagnosis not present

## 2016-08-29 DIAGNOSIS — M9902 Segmental and somatic dysfunction of thoracic region: Secondary | ICD-10-CM | POA: Diagnosis not present

## 2016-09-02 DIAGNOSIS — M5134 Other intervertebral disc degeneration, thoracic region: Secondary | ICD-10-CM | POA: Diagnosis not present

## 2016-09-02 DIAGNOSIS — M9902 Segmental and somatic dysfunction of thoracic region: Secondary | ICD-10-CM | POA: Diagnosis not present

## 2016-09-02 DIAGNOSIS — M9903 Segmental and somatic dysfunction of lumbar region: Secondary | ICD-10-CM | POA: Diagnosis not present

## 2016-09-02 DIAGNOSIS — M5136 Other intervertebral disc degeneration, lumbar region: Secondary | ICD-10-CM | POA: Diagnosis not present

## 2016-09-06 DIAGNOSIS — M9902 Segmental and somatic dysfunction of thoracic region: Secondary | ICD-10-CM | POA: Diagnosis not present

## 2016-09-06 DIAGNOSIS — M5134 Other intervertebral disc degeneration, thoracic region: Secondary | ICD-10-CM | POA: Diagnosis not present

## 2016-09-06 DIAGNOSIS — M5136 Other intervertebral disc degeneration, lumbar region: Secondary | ICD-10-CM | POA: Diagnosis not present

## 2016-09-06 DIAGNOSIS — M9903 Segmental and somatic dysfunction of lumbar region: Secondary | ICD-10-CM | POA: Diagnosis not present

## 2016-09-09 DIAGNOSIS — M5134 Other intervertebral disc degeneration, thoracic region: Secondary | ICD-10-CM | POA: Diagnosis not present

## 2016-09-09 DIAGNOSIS — M5136 Other intervertebral disc degeneration, lumbar region: Secondary | ICD-10-CM | POA: Diagnosis not present

## 2016-09-09 DIAGNOSIS — M9902 Segmental and somatic dysfunction of thoracic region: Secondary | ICD-10-CM | POA: Diagnosis not present

## 2016-09-09 DIAGNOSIS — M9903 Segmental and somatic dysfunction of lumbar region: Secondary | ICD-10-CM | POA: Diagnosis not present

## 2016-09-13 DIAGNOSIS — M5134 Other intervertebral disc degeneration, thoracic region: Secondary | ICD-10-CM | POA: Diagnosis not present

## 2016-09-13 DIAGNOSIS — M5136 Other intervertebral disc degeneration, lumbar region: Secondary | ICD-10-CM | POA: Diagnosis not present

## 2016-09-13 DIAGNOSIS — M9903 Segmental and somatic dysfunction of lumbar region: Secondary | ICD-10-CM | POA: Diagnosis not present

## 2016-09-13 DIAGNOSIS — M9902 Segmental and somatic dysfunction of thoracic region: Secondary | ICD-10-CM | POA: Diagnosis not present

## 2016-09-17 ENCOUNTER — Emergency Department
Admission: EM | Admit: 2016-09-17 | Discharge: 2016-09-17 | Disposition: A | Discharge status: Home / Self Care | Payer: Medicare Other | Attending: Emergency Medicine | Admitting: Emergency Medicine

## 2016-09-17 ENCOUNTER — Encounter: Payer: Self-pay | Admitting: Emergency Medicine

## 2016-09-17 DIAGNOSIS — R112 Nausea with vomiting, unspecified: Secondary | ICD-10-CM | POA: Diagnosis not present

## 2016-09-17 HISTORY — DX: Personal history of other malignant neoplasm of skin: Z85.828

## 2016-09-17 LAB — CBC WITH DIFFERENTIAL/PLATELET
BASOS ABS: 0 10*3/uL (ref 0–0.1)
BASOS PCT: 0 %
Eosinophils Absolute: 0 10*3/uL (ref 0–0.7)
Eosinophils Relative: 0 %
HEMATOCRIT: 33.4 % — AB (ref 35.0–47.0)
Hemoglobin: 11.1 g/dL — ABNORMAL LOW (ref 12.0–16.0)
Lymphocytes Relative: 3 %
Lymphs Abs: 0.2 10*3/uL — ABNORMAL LOW (ref 1.0–3.6)
MCH: 26.6 pg (ref 26.0–34.0)
MCHC: 33.2 g/dL (ref 32.0–36.0)
MCV: 80.2 fL (ref 80.0–100.0)
MONO ABS: 0.2 10*3/uL (ref 0.2–0.9)
Monocytes Relative: 2 %
NEUTROS ABS: 8.1 10*3/uL — AB (ref 1.4–6.5)
Neutrophils Relative %: 95 %
PLATELETS: 199 10*3/uL (ref 150–440)
RBC: 4.16 MIL/uL (ref 3.80–5.20)
RDW: 16.2 % — AB (ref 11.5–14.5)
WBC: 8.5 10*3/uL (ref 3.6–11.0)

## 2016-09-17 LAB — URINALYSIS, COMPLETE (UACMP) WITH MICROSCOPIC
BACTERIA UA: NONE SEEN
BILIRUBIN URINE: NEGATIVE
Glucose, UA: NEGATIVE mg/dL
KETONES UR: NEGATIVE mg/dL
LEUKOCYTES UA: NEGATIVE
Nitrite: NEGATIVE
PH: 5 (ref 5.0–8.0)
Protein, ur: NEGATIVE mg/dL
Specific Gravity, Urine: 1.01 (ref 1.005–1.030)

## 2016-09-17 LAB — LIPASE, BLOOD: LIPASE: 26 U/L (ref 11–51)

## 2016-09-17 LAB — COMPREHENSIVE METABOLIC PANEL
ALBUMIN: 3.9 g/dL (ref 3.5–5.0)
ALT: 14 U/L (ref 14–54)
AST: 31 U/L (ref 15–41)
Alkaline Phosphatase: 46 U/L (ref 38–126)
Anion gap: 7 (ref 5–15)
BILIRUBIN TOTAL: 0.7 mg/dL (ref 0.3–1.2)
BUN: 13 mg/dL (ref 6–20)
CO2: 27 mmol/L (ref 22–32)
CREATININE: 0.62 mg/dL (ref 0.44–1.00)
Calcium: 8.7 mg/dL — ABNORMAL LOW (ref 8.9–10.3)
Chloride: 106 mmol/L (ref 101–111)
GFR calc Af Amer: >60 mL/min (ref >60)
GLUCOSE: 102 mg/dL — AB (ref 65–99)
Potassium: 3.6 mmol/L (ref 3.5–5.1)
Sodium: 140 mmol/L (ref 135–145)
TOTAL PROTEIN: 6.6 g/dL (ref 6.5–8.1)

## 2016-09-17 MED ORDER — ONDANSETRON HCL 4 MG PO TABS: 4.0000 mg | ORAL_TABLET | Freq: Three times a day (TID) | ORAL | 0 refills | Status: DC | PRN

## 2016-09-17 NOTE — Discharge Instructions (Signed)
Please seek medical attention for any high fevers, chest pain, shortness of breath, change in behavior, persistent vomiting, bloody stool or any other new or concerning symptoms.  

## 2016-09-17 NOTE — ED Notes (Signed)
Given water, ok per dr goodman 

## 2016-09-17 NOTE — ED Triage Notes (Signed)
Pt c/o nausea and vomiting starting this morning. Denies pain. Feels better after 4 mg zofran given by EMS

## 2016-09-17 NOTE — ED Provider Notes (Signed)
[facility] Emergency Department Provider Note   ____________________________________________   I have reviewed the triage vital signs and the nursing notes.   HISTORY  Chief Complaint Emesis   History limited by: Not Limited   HPI Lynn Vasquez is a 83 y.o. female who presents to the emergency department today via EMS because of concerns for nausea and vomiting. The symptoms started today. She had multiple episodes of vomiting.She denies any blood in the vomit. She denies any associated abdominal pain. Stool is a little looser this morning however no blood or black or tarry stool. She denies any fevers. Denies any unusual ingestions. Denies any known sick contacts.    Past Medical History:  Diagnosis Date  . History of skin cancer     There are no active problems to display for this patient.   Past Surgical History:  Procedure Laterality Date  . CHOLECYSTECTOMY    . goiter surgery    . HAND SURGERY    . TONSILLECTOMY      Prior to Admission medications   Not on File    Allergies Novocain [procaine] and Penicillins  History reviewed. No pertinent family history.  Social History Social History  Substance Use Topics  . Smoking status: Never Smoker  . Smokeless tobacco: Never Used  . Alcohol use No    Review of Systems Constitutional: No fever/chills Eyes: No visual changes. ENT: No sore throat. Cardiovascular: Denies chest pain. Respiratory: Denies shortness of breath. Gastrointestinal: No abdominal pain.  Positive for nausea and vomiting. Genitourinary: Negative for dysuria. Musculoskeletal: Negative for back pain. Skin: Negative for rash. Neurological: Negative for headaches, focal weakness or numbness.  ____________________________________________   PHYSICAL EXAM:  VITAL SIGNS: ED Triage Vitals  Enc Vitals Group     BP 09/17/16 1205 123/69     Pulse Rate 09/17/16 1205 97     Resp 09/17/16 1205 18     Temp 09/17/16  1209 99.6 F (37.6 C)     Temp Source 09/17/16 1209 Oral     SpO2 09/17/16 1205 96 %     Weight 09/17/16 1209 130 lb (59 kg)     Height 09/17/16 1209 5\' 2"  (1.575 m)    Constitutional: Alert and oriented. Well appearing and in no distress. Eyes: Conjunctivae are normal.  ENT   Head: Normocephalic and atraumatic.   Nose: No congestion/rhinnorhea.   Mouth/Throat: Mucous membranes are moist.   Neck: No stridor. Hematological/Lymphatic/Immunilogical: No cervical lymphadenopathy. Cardiovascular: Normal rate, regular rhythm.  No murmurs, rubs, or gallops. Respiratory: Normal respiratory effort without tachypnea nor retractions. Breath sounds are clear and equal bilaterally. No wheezes/rales/rhonchi. Gastrointestinal: Soft and non tender. No rebound. No guarding.  Genitourinary: Deferred Musculoskeletal: Normal range of motion in all extremities. No lower extremity edema. Neurologic:  Normal speech and language. No gross focal neurologic deficits are appreciated.  Skin:  Skin is warm, dry and intact. No rash noted. Psychiatric: Mood and affect are normal. Speech and behavior are normal. Patient exhibits appropriate insight and judgment.  ____________________________________________    LABS (pertinent positives/negatives)  Labs Reviewed  CBC WITH DIFFERENTIAL/PLATELET - Abnormal; Notable for the following:       Result Value   Hemoglobin 11.1 (*)    HCT 33.4 (*)    RDW 16.2 (*)    Neutro Abs 8.1 (*)    Lymphs Abs 0.2 (*)    All other components within normal limits  COMPREHENSIVE METABOLIC PANEL - Abnormal; Notable for the following:    Glucose,  Bld 102 (*)    Calcium 8.7 (*)    All other components within normal limits  URINALYSIS, COMPLETE (UACMP) WITH MICROSCOPIC - Abnormal; Notable for the following:    Color, Urine YELLOW (*)    APPearance CLEAR (*)    Hgb urine dipstick SMALL (*)    Squamous Epithelial / LPF 0-5 (*)    All other components within normal  limits  LIPASE, BLOOD  CBC WITH DIFFERENTIAL/PLATELET  URINALYSIS, COMPLETE (UACMP) WITH MICROSCOPIC     ____________________________________________   EKG  None  ____________________________________________    RADIOLOGY  None  ____________________________________________   PROCEDURES  Procedures  ____________________________________________   INITIAL IMPRESSION / ASSESSMENT AND PLAN / ED COURSE  Pertinent labs & imaging results that were available during my care of the patient were reviewed by me and considered in my medical decision making (see chart for details).  Patient presented to the emergency department today because of concerns for nausea and vomiting. Patient had been given Zofran by EMS. She did not have any further episodes of nausea vomiting here in the emergency department. Abdomen was benign and nontender. Blood work without concerning findings. Will plan on discharging home with nausea medication. Discussed return precautions.   ____________________________________________   FINAL CLINICAL IMPRESSION(S) / ED DIAGNOSES  Final diagnoses:  Nausea and vomiting, intractability of vomiting not specified, unspecified vomiting type     Note: This dictation was prepared with Dragon dictation. Any transcriptional errors that result from this process are unintentional     Nance Pear, MD 09/17/16 1425

## 2016-09-20 DIAGNOSIS — M9902 Segmental and somatic dysfunction of thoracic region: Secondary | ICD-10-CM | POA: Diagnosis not present

## 2016-09-20 DIAGNOSIS — M9903 Segmental and somatic dysfunction of lumbar region: Secondary | ICD-10-CM | POA: Diagnosis not present

## 2016-09-20 DIAGNOSIS — M5136 Other intervertebral disc degeneration, lumbar region: Secondary | ICD-10-CM | POA: Diagnosis not present

## 2016-09-20 DIAGNOSIS — M5134 Other intervertebral disc degeneration, thoracic region: Secondary | ICD-10-CM | POA: Diagnosis not present

## 2016-09-22 DIAGNOSIS — M9903 Segmental and somatic dysfunction of lumbar region: Secondary | ICD-10-CM | POA: Diagnosis not present

## 2016-09-22 DIAGNOSIS — M5134 Other intervertebral disc degeneration, thoracic region: Secondary | ICD-10-CM | POA: Diagnosis not present

## 2016-09-22 DIAGNOSIS — M5136 Other intervertebral disc degeneration, lumbar region: Secondary | ICD-10-CM | POA: Diagnosis not present

## 2016-09-22 DIAGNOSIS — M9902 Segmental and somatic dysfunction of thoracic region: Secondary | ICD-10-CM | POA: Diagnosis not present

## 2016-09-27 DIAGNOSIS — M9903 Segmental and somatic dysfunction of lumbar region: Secondary | ICD-10-CM | POA: Diagnosis not present

## 2016-09-27 DIAGNOSIS — M5136 Other intervertebral disc degeneration, lumbar region: Secondary | ICD-10-CM | POA: Diagnosis not present

## 2016-09-27 DIAGNOSIS — M5134 Other intervertebral disc degeneration, thoracic region: Secondary | ICD-10-CM | POA: Diagnosis not present

## 2016-09-27 DIAGNOSIS — M9902 Segmental and somatic dysfunction of thoracic region: Secondary | ICD-10-CM | POA: Diagnosis not present

## 2016-09-30 DIAGNOSIS — M5136 Other intervertebral disc degeneration, lumbar region: Secondary | ICD-10-CM | POA: Diagnosis not present

## 2016-09-30 DIAGNOSIS — M9903 Segmental and somatic dysfunction of lumbar region: Secondary | ICD-10-CM | POA: Diagnosis not present

## 2016-09-30 DIAGNOSIS — M9902 Segmental and somatic dysfunction of thoracic region: Secondary | ICD-10-CM | POA: Diagnosis not present

## 2016-09-30 DIAGNOSIS — M5134 Other intervertebral disc degeneration, thoracic region: Secondary | ICD-10-CM | POA: Diagnosis not present

## 2016-10-04 DIAGNOSIS — M5136 Other intervertebral disc degeneration, lumbar region: Secondary | ICD-10-CM | POA: Diagnosis not present

## 2016-10-04 DIAGNOSIS — M9903 Segmental and somatic dysfunction of lumbar region: Secondary | ICD-10-CM | POA: Diagnosis not present

## 2016-10-04 DIAGNOSIS — M5134 Other intervertebral disc degeneration, thoracic region: Secondary | ICD-10-CM | POA: Diagnosis not present

## 2016-10-04 DIAGNOSIS — M9902 Segmental and somatic dysfunction of thoracic region: Secondary | ICD-10-CM | POA: Diagnosis not present

## 2016-10-11 DIAGNOSIS — M9903 Segmental and somatic dysfunction of lumbar region: Secondary | ICD-10-CM | POA: Diagnosis not present

## 2016-10-11 DIAGNOSIS — M9902 Segmental and somatic dysfunction of thoracic region: Secondary | ICD-10-CM | POA: Diagnosis not present

## 2016-10-11 DIAGNOSIS — M5134 Other intervertebral disc degeneration, thoracic region: Secondary | ICD-10-CM | POA: Diagnosis not present

## 2016-10-11 DIAGNOSIS — M5136 Other intervertebral disc degeneration, lumbar region: Secondary | ICD-10-CM | POA: Diagnosis not present

## 2016-10-26 DIAGNOSIS — Z961 Presence of intraocular lens: Secondary | ICD-10-CM | POA: Diagnosis not present

## 2016-11-07 ENCOUNTER — Ambulatory Visit (INDEPENDENT_AMBULATORY_CARE_PROVIDER_SITE_OTHER): Payer: Medicare Other | Admitting: Family Medicine

## 2016-11-07 ENCOUNTER — Encounter: Payer: Self-pay | Admitting: Family Medicine

## 2016-11-07 VITALS — BP 114/62 | HR 76 | Temp 97.9°F | Resp 14 | Ht 64.0 in | Wt 120.7 lb

## 2016-11-07 DIAGNOSIS — M810 Age-related osteoporosis without current pathological fracture: Secondary | ICD-10-CM | POA: Diagnosis not present

## 2016-11-07 DIAGNOSIS — R634 Abnormal weight loss: Secondary | ICD-10-CM

## 2016-11-07 DIAGNOSIS — Z789 Other specified health status: Secondary | ICD-10-CM

## 2016-11-07 DIAGNOSIS — E890 Postprocedural hypothyroidism: Secondary | ICD-10-CM | POA: Diagnosis not present

## 2016-11-07 DIAGNOSIS — Z23 Encounter for immunization: Secondary | ICD-10-CM

## 2016-11-07 DIAGNOSIS — L659 Nonscarring hair loss, unspecified: Secondary | ICD-10-CM

## 2016-11-07 DIAGNOSIS — D649 Anemia, unspecified: Secondary | ICD-10-CM

## 2016-11-07 HISTORY — DX: Other specified health status: Z78.9

## 2016-11-07 NOTE — Assessment & Plan Note (Signed)
See overview.   

## 2016-11-07 NOTE — Progress Notes (Signed)
BP 114/62   Pulse 76   Temp 97.9 F (36.6 C) (Oral)   Resp 14   Ht 5\' 4"  (1.626 m)   Wt 120 lb 11.2 oz (54.7 kg)   SpO2 95%   BMI 20.72 kg/m    Subjective:    [name], female    DOB: 1926-03-05, 83 y.o.   MRN: [id number]  HPI: Lynn Vasquez is a 83 y.o. female  Chief Complaint  Patient presents with  . New Patient (Initial Visit)    HPI Patient is here to establish care; I see her daughter; she just moved here two months ago Her husband died four years ago 16 in to the new home; requiring renovations, she is hiring people do that She eats anything she wants Her father died at age 27 or 42; mother lived into her 50s Taking no prescription medicine in her life Had skin cancer removed from the right leg years ago Veins stripped in the legs, terrible varicose veins with no ulcers On aspirin  She has a living will; if she is incurable, then don't prolong suffering she says Her husband had lung cancer, discovered 1 month before he died She lost a daughter at age 69 from an accident, had all of her papers drawn up then  She is still desiring mammograms; has had right breast surgery; most of the time has to have additional views  She has lost almost 10 pounds in 2 months; things have been hectic with the move; round of loose stools after moving; thinks that was stress from the move; hair loss noted Seeing chiropractor; stretching; used to be 5'4", and it back to that Hx of anemia between 1st two children; her doctor told her she would be anemic the rest of her life  Bones are getting thin; maybe last year  Depression screen PHQ 2/9 11/07/2016  Decreased Interest 0  Down, Depressed, Hopeless 0  PHQ - 2 Score 0    Relevant past medical, surgical, family and social history reviewed Past Medical History:  Diagnosis Date  . Full code status 11/07/2016   Discussed November 07, 2016; do try resuscitative measures if needed; however, patient does not  wish to be maintained on life support if terminably incurable or cancer  . History of skin cancer   . Pinched nerve   . Pneumonia   . Vertigo    Past Surgical History:  Procedure Laterality Date  . BREAST SURGERY Right 1660,6301  . CATARACT EXTRACTION, BILATERAL Bilateral 2013-2014  . CHOLECYSTECTOMY    . FINGER SURGERY Right    removed spur  . goiter surgery    . HAND SURGERY    . SKIN CANCER EXCISION Right    leg  . TOE SURGERY Right    removed bone in little toe  . TONSILLECTOMY    . VARICOSE VEIN SURGERY Right    Family History  Problem Relation Age of Onset  . COPD Mother   . Hernia Father   . CAD Father   . COPD Sister   . Cancer Sister        breast  . Cancer Sister        jaw?  . Heart disease Brother   . Anxiety disorder Son   . Obesity Son    Social History   Social History  . Marital status: Widowed    Spouse name: N/A  . Number of children: N/A  . Years of education: N/A   Occupational History  .  Not on file.   Social History Main Topics  . Smoking status: Never Smoker  . Smokeless tobacco: Never Used  . Alcohol use No  . Drug use: No  . Sexual activity: Not Currently   Other Topics Concern  . Not on file   Social History Narrative  . No narrative on file    Interim medical history since last visit reviewed. Allergies and medications reviewed  Review of Systems  Constitutional: Positive for unexpected weight change.  Gastrointestinal: Negative for blood in stool.  Genitourinary: Negative for hematuria.   Per HPI unless specifically indicated above     Objective:    BP 114/62   Pulse 76   Temp 97.9 F (36.6 C) (Oral)   Resp 14   Ht 5\' 4"  (1.626 m)   Wt 120 lb 11.2 oz (54.7 kg)   SpO2 95%   BMI 20.72 kg/m   Wt Readings from Last 3 Encounters:  11/07/16 120 lb 11.2 oz (54.7 kg)  09/17/16 130 lb (59 kg)    Physical Exam  Constitutional:  Elderly female, spry and energetic for age; no distress  Eyes: No scleral icterus.   Neck: No JVD present. No thyromegaly present.  Cardiovascular: Normal rate and regular rhythm.   Pulmonary/Chest: Effort normal and breath sounds normal.  Abdominal: She exhibits no distension.  Musculoskeletal: She exhibits no edema.  Nodule at the DIP middle finger right hand  Neurological: She is alert.  Skin: No pallor.  Psychiatric: She has a normal mood and affect. Her behavior is normal.       Assessment & Plan:   Problem List Items Addressed This Visit      Endocrine   Post-surgical hypothyroidism    Check TSH and free T4; most of thyroid removed      Relevant Orders   TSH (Completed)   T4, free (Completed)     Musculoskeletal and Integument   Osteoporosis   Relevant Medications   calcium carbonate (CALCIUM 600) 1500 (600 Ca) MG TABS tablet   cholecalciferol (VITAMIN D) 1000 units tablet   Other Relevant Orders   VITAMIN D 25 Hydroxy (Vit-D Deficiency, Fractures) (Completed)     Other   Weight loss   Relevant Orders   CBC with Differential/Platelet (Completed)   TSH (Completed)   COMPLETE METABOLIC PANEL WITH GFR (Completed)   T4, free (Completed)   Hair loss    Check thyroid today; possibly telogen effluvium      Full code status    See overview      Relevant Orders   Full code   Anemia - Primary   Relevant Orders   CBC with Differential/Platelet (Completed)    Other Visit Diagnoses    Needs flu shot       Relevant Orders   Flu vaccine HIGH DOSE PF (Fluzone High dose) (Completed)       Follow up plan: Return in about 6 weeks (around 12/19/2016) for follow-up visit with Dr. Sanda Klein.  An after-visit summary was printed and given to the patient at [facility].  Please see the patient instructions which may contain other information and recommendations beyond what is mentioned above in the assessment and plan.  Meds ordered this encounter  Medications  . calcium carbonate (CALCIUM 600) 1500 (600 Ca) MG TABS tablet    Sig: Take 600 mg of elemental  calcium by mouth 2 (two) times daily with a meal.  . cholecalciferol (VITAMIN D) 1000 units tablet    Sig: Take 1,000  Units by mouth daily.  Marland Kitchen DISCONTD: aspirin 81 MG chewable tablet    Sig: Chew 81 mg by mouth daily.  Marland Kitchen glucosamine-chondroitin 500-400 MG tablet    Sig: Take 1 tablet by mouth daily.  Marland Kitchen aspirin EC 81 MG tablet    Sig: Take 1 tablet (81 mg total) by mouth daily.    Orders Placed This Encounter  Procedures  . Flu vaccine HIGH DOSE PF (Fluzone High dose)  . CBC with Differential/Platelet  . VITAMIN D 25 Hydroxy (Vit-D Deficiency, Fractures)  . TSH  . COMPLETE METABOLIC PANEL WITH GFR  . T4, free  . Iron, TIBC and Ferritin Panel  . TEST AUTHORIZATION  . Full code

## 2016-11-07 NOTE — Assessment & Plan Note (Signed)
Check thyroid today; possibly telogen effluvium

## 2016-11-07 NOTE — Assessment & Plan Note (Signed)
Check TSH and free T4; most of thyroid removed

## 2016-11-10 ENCOUNTER — Other Ambulatory Visit: Payer: Self-pay

## 2016-11-10 DIAGNOSIS — D649 Anemia, unspecified: Secondary | ICD-10-CM

## 2016-11-11 ENCOUNTER — Telehealth: Payer: Self-pay | Admitting: Family Medicine

## 2016-11-11 LAB — TEST AUTHORIZATION

## 2016-11-11 LAB — CBC WITH DIFFERENTIAL/PLATELET
BASOS ABS: 42 {cells}/uL (ref 0–200)
Basophils Relative: 0.8 %
EOS ABS: 161 {cells}/uL (ref 15–500)
Eosinophils Relative: 3.1 %
HEMATOCRIT: 33.2 % — AB (ref 35.0–45.0)
HEMOGLOBIN: 10.6 g/dL — AB (ref 11.7–15.5)
Lymphs Abs: 1513 cells/uL (ref 850–3900)
MCH: 26.2 pg — AB (ref 27.0–33.0)
MCHC: 31.9 g/dL — AB (ref 32.0–36.0)
MCV: 82.2 fL (ref 80.0–100.0)
MONOS PCT: 10.7 %
MPV: 10.9 fL (ref 7.5–12.5)
NEUTROS ABS: 2928 {cells}/uL (ref 1500–7800)
Neutrophils Relative %: 56.3 %
Platelets: 281 10*3/uL (ref 140–400)
RBC: 4.04 10*6/uL (ref 3.80–5.10)
RDW: 14.6 % (ref 11.0–15.0)
Total Lymphocyte: 29.1 %
WBC mixed population: 556 cells/uL (ref 200–950)
WBC: 5.2 10*3/uL (ref 3.8–10.8)

## 2016-11-11 LAB — COMPLETE METABOLIC PANEL WITH GFR
AG RATIO: 1.6 (calc) (ref 1.0–2.5)
ALBUMIN MSPROF: 4.2 g/dL (ref 3.6–5.1)
ALT: 9 U/L (ref 6–29)
AST: 19 U/L (ref 10–35)
Alkaline phosphatase (APISO): 50 U/L (ref 33–130)
BILIRUBIN TOTAL: 0.4 mg/dL (ref 0.2–1.2)
BUN: 14 mg/dL (ref 7–25)
CHLORIDE: 104 mmol/L (ref 98–110)
CO2: 30 mmol/L (ref 20–32)
Calcium: 9.2 mg/dL (ref 8.6–10.4)
Creat: 0.67 mg/dL (ref 0.60–0.88)
GFR, EST AFRICAN AMERICAN: 91 mL/min/{1.73_m2} (ref >=60)
GFR, Est Non African American: 78 mL/min/{1.73_m2} (ref >=60)
GLOBULIN: 2.7 g/dL (ref 1.9–3.7)
Glucose, Bld: 92 mg/dL (ref 65–139)
Potassium: 4.2 mmol/L (ref 3.5–5.3)
SODIUM: 139 mmol/L (ref 135–146)
TOTAL PROTEIN: 6.9 g/dL (ref 6.1–8.1)

## 2016-11-11 LAB — IRON,TIBC AND FERRITIN PANEL
%SAT: 5 % (calc) — ABNORMAL LOW (ref 11–50)
Ferritin: 11 ng/mL — ABNORMAL LOW (ref 20–288)
Iron: 24 ug/dL — ABNORMAL LOW (ref 45–160)
TIBC: 444 mcg/dL (calc) (ref 250–450)

## 2016-11-11 LAB — T4, FREE: FREE T4: 1.3 ng/dL (ref 0.8–1.8)

## 2016-11-11 LAB — VITAMIN D 25 HYDROXY (VIT D DEFICIENCY, FRACTURES): VIT D 25 HYDROXY: 26 ng/mL — AB (ref 30–100)

## 2016-11-11 LAB — TSH: TSH: 2.18 m[IU]/L (ref 0.40–4.50)

## 2016-11-11 NOTE — Telephone Encounter (Signed)
Called daughter back unsure who called? I dont see where we called?

## 2016-11-11 NOTE — Telephone Encounter (Signed)
Pt daughter Stephanie Coup) is returning your call [phone]

## 2016-11-18 ENCOUNTER — Other Ambulatory Visit: Payer: Self-pay | Admitting: Family Medicine

## 2016-11-18 DIAGNOSIS — D649 Anemia, unspecified: Secondary | ICD-10-CM

## 2016-11-18 LAB — POC HEMOCCULT BLD/STL (HOME/3-CARD/SCREEN)
Card #2 Fecal Occult Blod, POC: NEGATIVE
FECAL OCCULT BLD: NEGATIVE
FECAL OCCULT BLD: NEGATIVE

## 2016-12-19 ENCOUNTER — Encounter: Payer: Self-pay | Admitting: Family Medicine

## 2016-12-19 ENCOUNTER — Ambulatory Visit (INDEPENDENT_AMBULATORY_CARE_PROVIDER_SITE_OTHER): Payer: Medicare Other | Admitting: Family Medicine

## 2016-12-19 VITALS — BP 124/84 | HR 71 | Temp 98.2°F | Resp 16 | Wt 121.9 lb

## 2016-12-19 DIAGNOSIS — Z1231 Encounter for screening mammogram for malignant neoplasm of breast: Secondary | ICD-10-CM

## 2016-12-19 DIAGNOSIS — E890 Postprocedural hypothyroidism: Secondary | ICD-10-CM

## 2016-12-19 DIAGNOSIS — M810 Age-related osteoporosis without current pathological fracture: Secondary | ICD-10-CM | POA: Diagnosis not present

## 2016-12-19 DIAGNOSIS — D649 Anemia, unspecified: Secondary | ICD-10-CM | POA: Diagnosis not present

## 2016-12-19 DIAGNOSIS — Z1239 Encounter for other screening for malignant neoplasm of breast: Secondary | ICD-10-CM

## 2016-12-19 DIAGNOSIS — E538 Deficiency of other specified B group vitamins: Secondary | ICD-10-CM | POA: Diagnosis not present

## 2016-12-19 NOTE — Patient Instructions (Addendum)
Request DEXA scan from [facility] contact you about the labs we draw today   Fall Prevention in the Home Falls can cause injuries. They can happen to people of all ages. There are many things you can do to make your home safe and to help prevent falls. What can I do on the outside of my home?  Regularly fix the edges of walkways and driveways and fix any cracks.  Remove anything that might make you trip as you walk through a door, such as a raised step or threshold.  Trim any bushes or trees on the path to your home.  Use bright outdoor lighting.  Clear any walking paths of anything that might make someone trip, such as rocks or tools.  Regularly check to see if handrails are loose or broken. Make sure that both sides of any steps have handrails.  Any raised decks and porches should have guardrails on the edges.  Have any leaves, snow, or ice cleared regularly.  Use sand or salt on walking paths during winter.  Clean up any spills in your garage right away. This includes oil or grease spills. What can I do in the bathroom?  Use night lights.  Install grab bars by the toilet and in the tub and shower. Do not use towel bars as grab bars.  Use non-skid mats or decals in the tub or shower.  If you need to sit down in the shower, use a plastic, non-slip stool.  Keep the floor dry. Clean up any water that spills on the floor as soon as it happens.  Remove soap buildup in the tub or shower regularly.  Attach bath mats securely with double-sided non-slip rug tape.  Do not have throw rugs and other things on the floor that can make you trip. What can I do in the bedroom?  Use night lights.  Make sure that you have a light by your bed that is easy to reach.  Do not use any sheets or blankets that are too big for your bed. They should not hang down onto the floor.  Have a firm chair that has side arms. You can use this for support while you get dressed.  Do not have  throw rugs and other things on the floor that can make you trip. What can I do in the kitchen?  Clean up any spills right away.  Avoid walking on wet floors.  Keep items that you use a lot in easy-to-reach places.  If you need to reach something above you, use a strong step stool that has a grab bar.  Keep electrical cords out of the way.  Do not use floor polish or wax that makes floors slippery. If you must use wax, use non-skid floor wax.  Do not have throw rugs and other things on the floor that can make you trip. What can I do with my stairs?  Do not leave any items on the stairs.  Make sure that there are handrails on both sides of the stairs and use them. Fix handrails that are broken or loose. Make sure that handrails are as long as the stairways.  Check any carpeting to make sure that it is firmly attached to the stairs. Fix any carpet that is loose or worn.  Avoid having throw rugs at the top or bottom of the stairs. If you do have throw rugs, attach them to the floor with carpet tape.  Make sure that you have a light  switch at the top of the stairs and the bottom of the stairs. If you do not have them, ask someone to add them for you. What else can I do to help prevent falls?  Wear shoes that: ? Do not have high heels. ? Have rubber bottoms. ? Are comfortable and fit you well. ? Are closed at the toe. Do not wear sandals.  If you use a stepladder: ? Make sure that it is fully opened. Do not climb a closed stepladder. ? Make sure that both sides of the stepladder are locked into place. ? Ask someone to hold it for you, if possible.  Clearly mark and make sure that you can see: ? Any grab bars or handrails. ? First and last steps. ? Where the edge of each step is.  Use tools that help you move around (mobility aids) if they are needed. These include: ? Canes. ? Walkers. ? Scooters. ? Crutches.  Turn on the lights when you go into a dark area. Replace any  light bulbs as soon as they burn out.  Set up your furniture so you have a clear path. Avoid moving your furniture around.  If any of your floors are uneven, fix them.  If there are any pets around you, be aware of where they are.  Review your medicines with your doctor. Some medicines can make you feel dizzy. This can increase your chance of falling. Ask your doctor what other things that you can do to help prevent falls. This information is not intended to replace advice given to you by your health care provider. Make sure you discuss any questions you have with your health care provider. Document Released: 11/06/2008 Document Revised: 06/18/2015 Document Reviewed: 02/14/2014 Elsevier Interactive Patient Education  2018 [employer]. Let's get labs today Practice good fall precautions like you are doing Fall Prevention in the Home Falls can cause injuries. They can happen to people of all ages. There are many things you can do to make your home safe and to help prevent falls. What can I do on the outside of my home?  Regularly fix the edges of walkways and driveways and fix any cracks.  Remove anything that might make you trip as you walk through a door, such as a raised step or threshold.  Trim any bushes or trees on the path to your home.  Use bright outdoor lighting.  Clear any walking paths of anything that might make someone trip, such as rocks or tools.  Regularly check to see if handrails are loose or broken. Make sure that both sides of any steps have handrails.  Any raised decks and porches should have guardrails on the edges.  Have any leaves, snow, or ice cleared regularly.  Use sand or salt on walking paths during winter.  Clean up any spills in your garage right away. This includes oil or grease spills. What can I do in the bathroom?  Use night lights.  Install grab bars by the toilet and in the tub and shower. Do not use towel bars as grab bars.  Use  non-skid mats or decals in the tub or shower.  If you need to sit down in the shower, use a plastic, non-slip stool.  Keep the floor dry. Clean up any water that spills on the floor as soon as it happens.  Remove soap buildup in the tub or shower regularly.  Attach bath mats securely with double-sided non-slip rug tape.  Do not have  throw rugs and other things on the floor that can make you trip. What can I do in the bedroom?  Use night lights.  Make sure that you have a light by your bed that is easy to reach.  Do not use any sheets or blankets that are too big for your bed. They should not hang down onto the floor.  Have a firm chair that has side arms. You can use this for support while you get dressed.  Do not have throw rugs and other things on the floor that can make you trip. What can I do in the kitchen?  Clean up any spills right away.  Avoid walking on wet floors.  Keep items that you use a lot in easy-to-reach places.  If you need to reach something above you, use a strong step stool that has a grab bar.  Keep electrical cords out of the way.  Do not use floor polish or wax that makes floors slippery. If you must use wax, use non-skid floor wax.  Do not have throw rugs and other things on the floor that can make you trip. What can I do with my stairs?  Do not leave any items on the stairs.  Make sure that there are handrails on both sides of the stairs and use them. Fix handrails that are broken or loose. Make sure that handrails are as long as the stairways.  Check any carpeting to make sure that it is firmly attached to the stairs. Fix any carpet that is loose or worn.  Avoid having throw rugs at the top or bottom of the stairs. If you do have throw rugs, attach them to the floor with carpet tape.  Make sure that you have a light switch at the top of the stairs and the bottom of the stairs. If you do not have them, ask someone to add them for you. What  else can I do to help prevent falls?  Wear shoes that: ? Do not have high heels. ? Have rubber bottoms. ? Are comfortable and fit you well. ? Are closed at the toe. Do not wear sandals.  If you use a stepladder: ? Make sure that it is fully opened. Do not climb a closed stepladder. ? Make sure that both sides of the stepladder are locked into place. ? Ask someone to hold it for you, if possible.  Clearly mark and make sure that you can see: ? Any grab bars or handrails. ? First and last steps. ? Where the edge of each step is.  Use tools that help you move around (mobility aids) if they are needed. These include: ? Canes. ? Walkers. ? Scooters. ? Crutches.  Turn on the lights when you go into a dark area. Replace any light bulbs as soon as they burn out.  Set up your furniture so you have a clear path. Avoid moving your furniture around.  If any of your floors are uneven, fix them.  If there are any pets around you, be aware of where they are.  Review your medicines with your doctor. Some medicines can make you feel dizzy. This can increase your chance of falling. Ask your doctor what other things that you can do to help prevent falls. This information is not intended to replace advice given to you by your health care provider. Make sure you discuss any questions you have with your health care provider. Document Released: 11/06/2008 Document Revised: 06/18/2015 Document Reviewed: 02/14/2014  Chartered certified accountant Patient Education  [employer].

## 2016-12-19 NOTE — Assessment & Plan Note (Signed)
Stable, last labs reviewed

## 2016-12-19 NOTE — Assessment & Plan Note (Signed)
Check B12 and supplement if needed

## 2016-12-19 NOTE — Assessment & Plan Note (Addendum)
Continue calcium and vitamin D; fall prevention encouraged; will re-request the DEXA scan report from [facility]

## 2016-12-19 NOTE — Assessment & Plan Note (Signed)
Check CBC and ferritin and iron; taking iron pills 3 times a week

## 2016-12-19 NOTE — Progress Notes (Signed)
BP 124/84   Pulse 71   Temp 98.2 F (36.8 C) (Oral)   Resp 16   Wt 121 lb 14.4 oz (55.3 kg)   SpO2 94%   BMI 20.92 kg/m    Subjective:    [name], female    DOB: [birth year], 83 y.o.   MRN: [id number]  HPI: Lynn Vasquez is a 83 y.o. female  Chief Complaint  Patient presents with  . Follow-up    HPI Patient is here for f/u She established care here not long ago  No medical excitement since last visit  She is taking glucosamine sulfate, but realized it had fish in it and stopped them completely; no lip swelling, no trouble breathing No problems with constipation; taking iron three days a week; not much red meat her whole life; not a big eater of greens, but can eat them; does eat cabbage  She used to have anemia, and had to take B12 shots, was told she needed B12 shots the rest of her life She has been trying to gain some weight; eating ice cream, drinking milk; eating eggs She takes calcium as well for her bones  Postsurgical hypothyroidism; they left her some on each side, but stable without medicine she says Lab Results  Component Value Date   TSH 2.18 11/07/2016   Osteoporosis, last DEXA 2017, do not have a copy on the chart; done in [place]; records were requested earlier but I do not see them under media tab or imaging tab  Depression screen [facility] 2/9 12/19/2016 11/07/2016  Decreased Interest 0 0  Down, Depressed, Hopeless 0 0  PHQ - 2 Score 0 0   Relevant past medical, surgical, family and social history reviewed Past Medical History:  Diagnosis Date  . Full code status 11/07/2016   Discussed November 07, 2016; do try resuscitative measures if needed; however, patient does not wish to be maintained on life support if terminably incurable or cancer  . History of skin cancer   . Pinched nerve   . Pneumonia   . Vertigo    Past Surgical History:  Procedure Laterality Date  . BREAST SURGERY Right 2426,8341  . CATARACT EXTRACTION,  BILATERAL Bilateral 2013-2014  . CHOLECYSTECTOMY    . FINGER SURGERY Right    removed spur  . goiter surgery    . HAND SURGERY    . SKIN CANCER EXCISION Right    leg  . TOE SURGERY Right    removed bone in little toe  . TONSILLECTOMY    . VARICOSE VEIN SURGERY Right    Family History  Problem Relation Age of Onset  . COPD Mother   . Hernia Father   . CAD Father   . COPD Sister   . Cancer Sister        breast  . Cancer Sister        jaw?  . Heart disease Brother   . Anxiety disorder Son   . Obesity Son    Social History   Tobacco Use  . Smoking status: Never Smoker  . Smokeless tobacco: Never Used  Substance Use Topics  . Alcohol use: No  . Drug use: No    Interim medical history since last visit reviewed. Allergies and medications reviewed  Review of Systems Per HPI unless specifically indicated above     Objective:    BP 124/84   Pulse 71   Temp 98.2 F (36.8 C) (Oral)   Resp 16   Wt  121 lb 14.4 oz (55.3 kg)   SpO2 94%   BMI 20.92 kg/m   Wt Readings from Last 3 Encounters:  12/19/16 121 lb 14.4 oz (55.3 kg)  11/07/16 120 lb 11.2 oz (54.7 kg)  09/17/16 130 lb (59 kg)    Physical Exam  Constitutional:  Elderly female, spry and energetic for age; no distress  Eyes: No scleral icterus.  Neck: No JVD present. No thyromegaly present.  Cardiovascular: Normal rate and regular rhythm.  Pulmonary/Chest: Effort normal and breath sounds normal.  Abdominal: She exhibits no distension.  Musculoskeletal: She exhibits no edema.  Neurological: She is alert.  Skin: No pallor.  Psychiatric: She has a normal mood and affect. Her behavior is normal. Her mood appears not anxious. Cognition and memory are not impaired. She does not exhibit a depressed mood.    Results for orders placed or performed in visit on 11/18/16  POC Hemoccult Bld/Stl (3-Cd Home Screen)  Result Value Ref Range   Card #1 Date 11/11/2016    Fecal Occult Blood, POC Negative Negative   Card  #2 Date 11/13/2016    Card #2 Fecal Occult Blod, POC Negative    Card #3 Date 11/14/2016    Card #3 Fecal Occult Blood, POC Negative       Assessment & Plan:   Problem List Items Addressed This Visit      Endocrine   Post-surgical hypothyroidism    Stable, last labs reviewed        Musculoskeletal and Integument   Osteoporosis - Primary    Continue calcium and vitamin D; fall prevention encouraged; will re-request the DEXA scan report from [place]      Relevant Medications   calcium carbonate (OS-CAL) 600 MG tablet     Other   Vitamin B12 deficiency    Check B12 and supplement if needed      Relevant Orders   Fe+TIBC+Fer   Anemia    Check CBC and ferritin and iron; taking iron pills 3 times a week      Relevant Medications   Iron-Folic Acid-Vit L87 (IRON FORMULA PO)   Other Relevant Orders   CBC with Differential/Platelet   B12    Other Visit Diagnoses    Screening for breast cancer       Relevant Orders   MM Digital Screening       Follow up plan: Return in about 6 months (around 06/18/2017) for follow-up visit with Dr. Sanda Klein.  An after-visit summary was printed and given to the patient at [facility].  Please see the patient instructions which may contain other information and recommendations beyond what is mentioned above in the assessment and plan.  No orders of the defined types were placed in this encounter.   Orders Placed This Encounter  Procedures  . MM Digital Screening  . CBC with Differential/Platelet  . B12  . Fe+TIBC+Fer

## 2016-12-20 ENCOUNTER — Telehealth: Payer: Self-pay

## 2016-12-20 LAB — CBC WITH DIFFERENTIAL/PLATELET
BASOS ABS: 52 {cells}/uL (ref 0–200)
Basophils Relative: 0.9 %
Eosinophils Absolute: 110 cells/uL (ref 15–500)
Eosinophils Relative: 1.9 %
HEMATOCRIT: 35.5 % (ref 35.0–45.0)
Hemoglobin: 11.3 g/dL — ABNORMAL LOW (ref 11.7–15.5)
LYMPHS ABS: 1375 {cells}/uL (ref 850–3900)
MCH: 26.2 pg — ABNORMAL LOW (ref 27.0–33.0)
MCHC: 31.8 g/dL — AB (ref 32.0–36.0)
MCV: 82.2 fL (ref 80.0–100.0)
MPV: 11.1 fL (ref 7.5–12.5)
Monocytes Relative: 9.4 %
NEUTROS PCT: 64.1 %
Neutro Abs: 3718 cells/uL (ref 1500–7800)
Platelets: 226 10*3/uL (ref 140–400)
RBC: 4.32 10*6/uL (ref 3.80–5.10)
RDW: 14.7 % (ref 11.0–15.0)
TOTAL LYMPHOCYTE: 23.7 %
WBC: 5.8 10*3/uL (ref 3.8–10.8)
WBCMIX: 545 {cells}/uL (ref 200–950)

## 2016-12-20 LAB — IRON,TIBC AND FERRITIN PANEL
%SAT: 60 % (calc) — ABNORMAL HIGH (ref 11–50)
Ferritin: 15 ng/mL — ABNORMAL LOW (ref 20–288)
Iron: 254 ug/dL — ABNORMAL HIGH (ref 45–160)
TIBC: 426 mcg/dL (calc) (ref 250–450)

## 2016-12-20 LAB — VITAMIN B12: Vitamin B-12: 302 pg/mL (ref 200–1100)

## 2016-12-20 NOTE — Telephone Encounter (Signed)
Called pt no answer. LM for pt informing her of lab results. Result note routed to [facility], CRM created.

## 2016-12-20 NOTE — Telephone Encounter (Signed)
-----   Message from Arnetha Courser, MD sent at 12/20/2016  8:29 AM EST ----- Please let the patient know that her anemia is somewhat improved. She does not need to take any more iron tablets. I would recommend that she start vitamin B12 250 or 500 mcg sublingual every day. Thank you

## 2017-01-10 ENCOUNTER — Ambulatory Visit
Admission: RE | Admit: 2017-01-10 | Discharge: 2017-01-10 | Disposition: A | Discharge status: Home / Self Care | Payer: Medicare Other | Source: Ambulatory Visit | Attending: Family Medicine | Admitting: Family Medicine

## 2017-01-10 DIAGNOSIS — Z1231 Encounter for screening mammogram for malignant neoplasm of breast: Secondary | ICD-10-CM | POA: Insufficient documentation

## 2017-01-10 DIAGNOSIS — Z1239 Encounter for other screening for malignant neoplasm of breast: Secondary | ICD-10-CM

## 2017-01-27 ENCOUNTER — Other Ambulatory Visit: Payer: Self-pay | Admitting: *Deleted

## 2017-01-27 ENCOUNTER — Inpatient Hospital Stay
Admission: RE | Admit: 2017-01-27 | Discharge: 2017-01-27 | Disposition: A | Discharge status: Home / Self Care | Payer: Self-pay | Source: Ambulatory Visit | Attending: *Deleted | Admitting: *Deleted

## 2017-01-27 DIAGNOSIS — Z9289 Personal history of other medical treatment: Secondary | ICD-10-CM

## 2017-03-29 ENCOUNTER — Telehealth: Payer: Self-pay | Admitting: Family Medicine

## 2017-03-29 NOTE — Telephone Encounter (Signed)
MyChart message sent from daughter in daughter's chart: ----- Message -----  From: Ritta Slot  Sent: 03/29/2017 11:40 AM  To: [facility]  Subject: Non-Urgent Medical Question             ----- Message from [place], Generic sent at 03/29/2017 11:40 AM EST -----    Hi, this question is from my mother, [place], who is also your patient. Her father was diagnosed with "hardening of the arteries" and died in his late 96s. He had severe memory issues and became violent, which was completely out of character. She wants to have her arteries checked to see if she might have the same issue. One of her concerns is that she has varicose veins, as he did, and wonders if that is a risk factor. She asked me about having tests through [place] which I discouraged. (Do you agree?) Is there a test you can recommend to look for any possible artery issues? She is 62 and in remarkably good health for her age.  She does not email, but if you want to speak to her you can call her at [phone]. Thank you for whatever help you can provide.

## 2017-03-30 NOTE — Telephone Encounter (Signed)
The lifeline type screening tests are actually reasonably well-priced for the information they provider I am not endorsing their services, but don't discourage folks from having them done either; my own parents get them Sometimes they will identify an issue that I can then order more specific, diagnostic testing for (carotid atherosclerosis, for example) If she wants an appointment to discuss, please schedule

## 2017-03-31 NOTE — Telephone Encounter (Signed)
Left detailed voicemail

## 2017-04-20 ENCOUNTER — Encounter: Payer: Self-pay | Admitting: Family Medicine

## 2017-04-20 ENCOUNTER — Ambulatory Visit (INDEPENDENT_AMBULATORY_CARE_PROVIDER_SITE_OTHER): Payer: Medicare Other | Admitting: Family Medicine

## 2017-04-20 VITALS — BP 130/78 | HR 87 | Temp 97.5°F | Ht 64.0 in | Wt 125.0 lb

## 2017-04-20 DIAGNOSIS — R519 Headache, unspecified: Secondary | ICD-10-CM

## 2017-04-20 DIAGNOSIS — R51 Headache: Secondary | ICD-10-CM | POA: Diagnosis not present

## 2017-04-20 DIAGNOSIS — Z818 Family history of other mental and behavioral disorders: Secondary | ICD-10-CM

## 2017-04-20 NOTE — Patient Instructions (Signed)
[facility] staff -- Request head CT and upper spine CT and EKG all done at [facility] in October of 2013

## 2017-04-20 NOTE — Progress Notes (Signed)
BP 130/78 (BP Location: Right Arm, Patient Position: Sitting, Cuff Size: Normal)   Pulse 87   Temp (!) 97.5 F (36.4 C) (Oral)   Ht 5\' 4"  (1.626 m)   Wt 125 lb (56.7 kg)   SpO2 97%   BMI 21.46 kg/m    Subjective:    [name], female    DOB: [birth year], 84 y.o.   MRN: [id number]  HPI: Lynn Vasquez is a 84 y.o. female  Chief Complaint  Patient presents with  . Follow-up  . Form Completion  . Head Injury    45yrs ago, aches from time to time    HPI Patient is here for a follow-up with her daughter  She has questions about Lifeline screening; had it five or six years ago and "everything was fine: Hr father had harderning of the arteries; he got dementia and was completely disabiled and paranoid; she does not want that to happen; she was not at home when this happened; he died at age 91  She had a head injury 5-1/2 years ago, still has aches from time to time, pointing over dome of scalp on the left side She fell and hit her head and went to the hospital and they did tests and sent her home; still irritating her right there, pointing over left dome of scalp; balance is pretty good; working on that; no falls  Depression screen [facility] 2/9 04/20/2017 12/19/2016 11/07/2016  Decreased Interest 0 0 0  Down, Depressed, Hopeless 0 0 0  PHQ - 2 Score 0 0 0    Relevant past medical, surgical, family and social history reviewed Past Medical History:  Diagnosis Date  . Full code status 11/07/2016   Discussed November 07, 2016; do try resuscitative measures if needed; however, patient does not wish to be maintained on life support if terminably incurable or cancer  . History of skin cancer   . Pinched nerve   . Pneumonia   . Vertigo    Past Surgical History:  Procedure Laterality Date  . BREAST LUMPECTOMY Right    1954- benign  . BREAST SURGERY Right [id number]  . CATARACT EXTRACTION, BILATERAL Bilateral 2013-2014  . CHOLECYSTECTOMY    . FINGER SURGERY Right    removed spur  . goiter surgery    . HAND SURGERY    . SKIN CANCER EXCISION Right    leg  . TOE SURGERY Right    removed bone in little toe  . TONSILLECTOMY    . VARICOSE VEIN SURGERY Right    Family History  Problem Relation Age of Onset  . COPD Mother   . Hernia Father   . CAD Father   . COPD Sister   . Breast cancer Sister   . Cancer Sister        breast  . Cancer Sister        jaw?  . Heart disease Brother   . Anxiety disorder Son   . Obesity Son    Social History   Tobacco Use  . Smoking status: Never Smoker  . Smokeless tobacco: Never Used  Substance Use Topics  . Alcohol use: No  . Drug use: No    Interim medical history since last visit reviewed. Allergies and medications reviewed  Review of Systems Per HPI unless specifically indicated above     Objective:    BP 130/78 (BP Location: Right Arm, Patient Position: Sitting, Cuff Size: Normal)   Pulse 87   Temp (!) 97.5 F (  36.4 C) (Oral)   Ht 5\' 4"  (1.626 m)   Wt 125 lb (56.7 kg)   SpO2 97%   BMI 21.46 kg/m   Wt Readings from Last 3 Encounters:  04/20/17 125 lb (56.7 kg)  12/19/16 121 lb 14.4 oz (55.3 kg)  11/07/16 120 lb 11.2 oz (54.7 kg)    Physical Exam  Constitutional: She appears well-developed and well-nourished.  HENT:  Head: Normocephalic and atraumatic.  Mouth/Throat: Mucous membranes are normal.  nontender over left side dome of scalp; no deformity  Eyes: EOM are normal. No scleral icterus.  Neck: Carotid bruit is not present.  Cardiovascular: Normal rate and regular rhythm.  Pulmonary/Chest: Effort normal and breath sounds normal.  Neurological: She is alert.  No facial asymmetry; excellent historian; missed scores on the 6-CIT were counting down to 0 instead of stopping at 1, not recalling the street number or street  Psychiatric: She has a normal mood and affect. Her behavior is normal. Her mood appears not anxious. Cognition and memory are not impaired. She does not exhibit a  depressed mood. She exhibits normal remote memory.      Assessment & Plan:   Problem List Items Addressed This Visit      Other   Headache    I do not think additional imaging is warranted, with initial head injury five years ago which was imaged at the time; no focal neurologic deficits      Family history of dementia - Primary    Screening test done today; she is well past the age that her father developed memory problems; discussed the types of dementia; I do not think medicine warranted; offered neurology appt if desired, will just observe for now; encouraged brain exercises, registration and recall          Follow up plan: Return in about 6 months (around 10/21/2017) for follow-up visit with Dr. Sanda Klein; Medicare Wellness visit with AMIE when due.  An after-visit summary was printed and given to the patient at [facility].  Please see the patient instructions which may contain other information and recommendations beyond what is mentioned above in the assessment and plan.  No orders of the defined types were placed in this encounter.   No orders of the defined types were placed in this encounter.

## 2017-04-21 ENCOUNTER — Telehealth: Payer: Self-pay | Admitting: Family Medicine

## 2017-04-21 NOTE — Telephone Encounter (Signed)
Copied from [place] [id number]. Topic: Quick Communication - See Telephone Encounter >> Apr 21, 2017  4:20 PM Cleaster Corin, [place] wrote: CRM for notification. See Telephone encounter for: 04/21/17.  Lynn Vasquez daughter calling to check on medical records that was supposed to be sent cb# [phone]

## 2017-04-24 DIAGNOSIS — R51 Headache: Secondary | ICD-10-CM

## 2017-04-24 DIAGNOSIS — R519 Headache, unspecified: Secondary | ICD-10-CM | POA: Insufficient documentation

## 2017-04-24 NOTE — Assessment & Plan Note (Signed)
I do not think additional imaging is warranted, with initial head injury five years ago which was imaged at the time; no focal neurologic deficits

## 2017-04-24 NOTE — Assessment & Plan Note (Signed)
Screening test done today; she is well past the age that her father developed memory problems; discussed the types of dementia; I do not think medicine warranted; offered neurology appt if desired, will just observe for now; encouraged brain exercises, registration and recall

## 2017-05-19 DIAGNOSIS — H6123 Impacted cerumen, bilateral: Secondary | ICD-10-CM | POA: Diagnosis not present

## 2017-05-19 DIAGNOSIS — H6063 Unspecified chronic otitis externa, bilateral: Secondary | ICD-10-CM | POA: Diagnosis not present

## 2017-06-16 DIAGNOSIS — Z0000 Encounter for general adult medical examination without abnormal findings: Secondary | ICD-10-CM | POA: Diagnosis not present

## 2017-06-21 ENCOUNTER — Telehealth: Payer: Self-pay | Admitting: Family Medicine

## 2017-06-21 DIAGNOSIS — M810 Age-related osteoporosis without current pathological fracture: Secondary | ICD-10-CM

## 2017-06-21 DIAGNOSIS — M8580 Other specified disorders of bone density and structure, unspecified site: Secondary | ICD-10-CM | POA: Insufficient documentation

## 2017-06-21 DIAGNOSIS — R3121 Asymptomatic microscopic hematuria: Secondary | ICD-10-CM

## 2017-06-21 DIAGNOSIS — M85852 Other specified disorders of bone density and structure, left thigh: Secondary | ICD-10-CM

## 2017-06-21 NOTE — Assessment & Plan Note (Addendum)
Not due for DEXA

## 2017-06-21 NOTE — Telephone Encounter (Signed)
Last DEXA done 05/03/2016; osteopenia

## 2017-06-21 NOTE — Telephone Encounter (Signed)
Pt.notified

## 2017-06-21 NOTE — Assessment & Plan Note (Signed)
Due May 05, 2018

## 2017-06-21 NOTE — Telephone Encounter (Signed)
Please update immunization history (see highlighted notes from outside records) I cannot find any record of a PCV-13; please recommend and bring patient in for that  She also had 2+ blood in her urine at one of her visits with her prior doctor; recommend coming in for urine micro and dip through [facility] (I've ordered it)

## 2017-06-22 ENCOUNTER — Encounter: Payer: Self-pay | Admitting: Family Medicine

## 2017-06-29 ENCOUNTER — Ambulatory Visit (INDEPENDENT_AMBULATORY_CARE_PROVIDER_SITE_OTHER): Payer: Medicare Other

## 2017-06-29 DIAGNOSIS — Z23 Encounter for immunization: Secondary | ICD-10-CM | POA: Diagnosis not present

## 2017-06-29 DIAGNOSIS — R3121 Asymptomatic microscopic hematuria: Secondary | ICD-10-CM

## 2017-06-30 LAB — URINALYSIS W MICROSCOPIC + REFLEX CULTURE
BACTERIA UA: NONE SEEN /HPF
Bilirubin Urine: NEGATIVE
Glucose, UA: NEGATIVE
Hyaline Cast: NONE SEEN /LPF
KETONES UR: NEGATIVE
LEUKOCYTE ESTERASE: NEGATIVE
Nitrites, Initial: NEGATIVE
PH: 5.5 (ref 5.0–8.0)
Protein, ur: NEGATIVE
RBC / HPF: NONE SEEN /HPF (ref 0–2)
SQUAMOUS EPITHELIAL / LPF: NONE SEEN /HPF (ref <=5)
Specific Gravity, Urine: 1.018 (ref 1.001–1.03)
WBC UA: NONE SEEN /HPF (ref 0–5)

## 2017-06-30 LAB — NO CULTURE INDICATED

## 2017-10-20 ENCOUNTER — Ambulatory Visit: Payer: Medicare Other | Admitting: Family Medicine

## 2017-10-23 ENCOUNTER — Ambulatory Visit
Admission: RE | Admit: 2017-10-23 | Discharge: 2017-10-23 | Disposition: A | Discharge status: Home / Self Care | Payer: Medicare Other | Source: Ambulatory Visit | Attending: Family Medicine | Admitting: Family Medicine

## 2017-10-23 ENCOUNTER — Encounter: Payer: Self-pay | Admitting: Family Medicine

## 2017-10-23 ENCOUNTER — Ambulatory Visit (INDEPENDENT_AMBULATORY_CARE_PROVIDER_SITE_OTHER): Payer: Medicare Other | Admitting: Family Medicine

## 2017-10-23 VITALS — BP 112/70 | HR 76 | Temp 98.3°F | Ht 64.0 in | Wt 128.6 lb

## 2017-10-23 DIAGNOSIS — M85852 Other specified disorders of bone density and structure, left thigh: Secondary | ICD-10-CM

## 2017-10-23 DIAGNOSIS — R39191 Need to immediately re-void: Secondary | ICD-10-CM | POA: Diagnosis not present

## 2017-10-23 DIAGNOSIS — R29898 Other symptoms and signs involving the musculoskeletal system: Secondary | ICD-10-CM

## 2017-10-23 DIAGNOSIS — R1310 Dysphagia, unspecified: Secondary | ICD-10-CM | POA: Diagnosis not present

## 2017-10-23 DIAGNOSIS — M25552 Pain in left hip: Secondary | ICD-10-CM

## 2017-10-23 DIAGNOSIS — R221 Localized swelling, mass and lump, neck: Secondary | ICD-10-CM

## 2017-10-23 DIAGNOSIS — Z23 Encounter for immunization: Secondary | ICD-10-CM | POA: Diagnosis not present

## 2017-10-23 NOTE — Assessment & Plan Note (Addendum)
Due for repeat DEXA scan in April 2020; discussed calcium intake, fall precautions, etc.

## 2017-10-23 NOTE — Progress Notes (Signed)
BP 112/70   Pulse 76   Temp 98.3 F (36.8 C)   Ht 5\' 4"  (1.626 m)   Wt 128 lb 9.6 oz (58.3 kg)   SpO2 97%   BMI 22.07 kg/m    Subjective:    [name], female    DOB: [birth year], 84 y.o.   MRN: [id number]  HPI: Lynn Vasquez is a 84 y.o. female  Chief Complaint  Patient presents with  . Follow-up  . Hip Pain    Left, onset one month ago, pain comes and goes.     HPI Patient is here for f/u; daughter is with her Just had life line screening done, Jun 16, 2017 and brought copies with her; normal tsh and vit D levels  Osteopenia was noted as something to check on Last DEXA April 2018 Getting calcium; not through dark green leafy vegetables; some broccoli; lots of dairy; 2% milk Carpet pulled up and wood floors; one level; steps to get into the house and uses the handrails; shower bars; she is considering a alert system Uses a treadmill at home, not fast; half a mile at a time  Father had hardening of the arteries years ago; not a smoker; ate a lot of fat and sugar Some eggs in her diet[place]  Left hip pain; going on for a month or less; on the side and upper buttock region; she has noticed some asymmtry and wants to make sure the left hip isn't lower than the other; no stomach issues; no early satiety, no bloating No blood in stool or urine  They removed part of her thyroid gland on both sides; that was 40-50 some years ago She has a hard area on the front of her neck; she is having trouble swallowing; has trouble swallowing; not having to make herself vomit; having to chew thoroughly  Depression screen [facility] 2/9 10/23/2017 04/20/2017 12/19/2016 11/07/2016  Decreased Interest 0 0 0 0  Down, Depressed, Hopeless 0 0 0 0  PHQ - 2 Score 0 0 0 0   Fall Risk  10/23/2017 04/20/2017 12/19/2016 11/07/2016 09/22/2015  Falls in the past year? No No No No No  Comment - - - - Emmi Telephone Survey: data to providers prior to load    Relevant past  medical, surgical, family and social history reviewed Past Medical History:  Diagnosis Date  . Full code status 11/07/2016   Discussed November 07, 2016; do try resuscitative measures if needed; however, patient does not wish to be maintained on life support if terminably incurable or cancer  . History of skin cancer   . Pinched nerve   . Pneumonia   . Vertigo    Past Surgical History:  Procedure Laterality Date  . BREAST LUMPECTOMY Right    1954- benign  . BREAST SURGERY Right [id number]  . CATARACT EXTRACTION, BILATERAL Bilateral 2013-2014  . CHOLECYSTECTOMY    . FINGER SURGERY Right    removed spur  . goiter surgery    . HAND SURGERY    . SKIN CANCER EXCISION Right    leg  . TOE SURGERY Right    removed bone in little toe  . TONSILLECTOMY    . VARICOSE VEIN SURGERY Right    Family History  Problem Relation Age of Onset  . COPD Mother   . Hernia Father   . CAD Father   . COPD Sister   . Breast cancer Sister   . Cancer Sister  breast  . Cancer Sister        jaw?  . Heart disease Brother   . Anxiety disorder Son   . Obesity Son    Social History   Tobacco Use  . Smoking status: Never Smoker  . Smokeless tobacco: Never Used  Substance Use Topics  . Alcohol use: No  . Drug use: No     Office Visit from 10/23/2017 in [facility]  AUDIT-C Score  0      Interim medical history since last visit reviewed. Allergies and medications reviewed  Review of Systems Per HPI unless specifically indicated above     Objective:    BP 112/70   Pulse 76   Temp 98.3 F (36.8 C)   Ht 5\' 4"  (1.626 m)   Wt 128 lb 9.6 oz (58.3 kg)   SpO2 97%   BMI 22.07 kg/m   Wt Readings from Last 3 Encounters:  10/23/17 128 lb 9.6 oz (58.3 kg)  04/20/17 125 lb (56.7 kg)  12/19/16 121 lb 14.4 oz (55.3 kg)    Physical Exam  Constitutional: She appears well-developed and well-nourished.  HENT:  Mouth/Throat: Mucous membranes are normal.  Eyes: EOM are  normal. No scleral icterus.  Neck:    Surgical scar c/w thyroid surgery many many years ago; prominent cartilaginous or firm midline structure felt (laryngeal prominence?)  Cardiovascular: Normal rate and regular rhythm.  Pulmonary/Chest: Effort normal and breath sounds normal.  Musculoskeletal:       Right hip: She exhibits normal range of motion.       Left hip: She exhibits tenderness. She exhibits normal range of motion and normal strength.       Legs: Tenderness over the lateral LEFT hip, posterolateral LEFT buttock; soft fatty tissue over lateral RIGHT superior hip  Skin: No pallor.  Psychiatric: She has a normal mood and affect. Her behavior is normal. Her mood appears not anxious. She does not exhibit a depressed mood.      Assessment & Plan:   Problem List Items Addressed This Visit      Musculoskeletal and Integument   Osteopenia - Primary    Due for repeat DEXA scan in April 2020; discussed calcium intake, fall precautions, etc.      Relevant Orders   DG HIP UNILAT WITH PELVIS 2-3 VIEWS LEFT    Other Visit Diagnoses    Acute hip pain, left       suspect this is bursitis on the LEFT; topical agent recommended; patient worried, so xray ordered   Relevant Orders   DG HIP UNILAT WITH PELVIS 2-3 VIEWS LEFT   Dysphagia, unspecified type       refer to ENT   Relevant Orders   Ambulatory referral to ENT   Mass of neck       this feels like laryngeal prominent, cartilage in the midline, but patient is concerned; will be glad to refer to ENT for evaluation   Relevant Orders   Ambulatory referral to ENT   Need for influenza vaccination       Relevant Orders   Flu vaccine HIGH DOSE PF (Fluzone High dose) (Completed)   Need to immediately re-void       Hip asymmetry       suspect fatty tissue along lateral RIGHT hip; when pulled up and back, hips appear symmetrical; pt worried, will get xray of LEFT hip       Follow up plan: No follow-ups on file.  An after-visit  summary was printed and given to the patient at [facility].  Please see the patient instructions which may contain other information and recommendations beyond what is mentioned above in the assessment and plan.  No orders of the defined types were placed in this encounter.   Orders Placed This Encounter  Procedures  . DG HIP UNILAT WITH PELVIS 2-3 VIEWS LEFT  . Flu vaccine HIGH DOSE PF (Fluzone High dose)  . Ambulatory referral to ENT

## 2017-10-23 NOTE — Patient Instructions (Addendum)
Please go across the street for your xrays Keep up the amazing job you are doing Consider alert type bracelet or necklace Topical rub for the pain Notify me of any changes We'll have you see the [facility] specialist

## 2017-11-13 ENCOUNTER — Other Ambulatory Visit: Payer: Self-pay | Admitting: Otolaryngology

## 2017-11-13 DIAGNOSIS — E041 Nontoxic single thyroid nodule: Secondary | ICD-10-CM

## 2017-11-13 DIAGNOSIS — R1312 Dysphagia, oropharyngeal phase: Secondary | ICD-10-CM | POA: Diagnosis not present

## 2017-11-14 ENCOUNTER — Other Ambulatory Visit: Payer: Self-pay | Admitting: Otolaryngology

## 2017-11-14 DIAGNOSIS — R1310 Dysphagia, unspecified: Secondary | ICD-10-CM

## 2017-11-16 ENCOUNTER — Ambulatory Visit
Admission: RE | Admit: 2017-11-16 | Discharge: 2017-11-16 | Disposition: A | Discharge status: Home / Self Care | Payer: Medicare Other | Source: Ambulatory Visit | Attending: Otolaryngology | Admitting: Otolaryngology

## 2017-11-16 DIAGNOSIS — E041 Nontoxic single thyroid nodule: Secondary | ICD-10-CM

## 2017-11-16 DIAGNOSIS — E042 Nontoxic multinodular goiter: Secondary | ICD-10-CM | POA: Insufficient documentation

## 2017-11-17 ENCOUNTER — Other Ambulatory Visit: Payer: Self-pay | Admitting: Otolaryngology

## 2017-11-17 DIAGNOSIS — E041 Nontoxic single thyroid nodule: Secondary | ICD-10-CM

## 2017-11-23 ENCOUNTER — Ambulatory Visit
Admission: RE | Admit: 2017-11-23 | Discharge: 2017-11-23 | Disposition: A | Discharge status: Home / Self Care | Payer: Medicare Other | Source: Ambulatory Visit | Attending: Otolaryngology | Admitting: Otolaryngology

## 2017-11-23 DIAGNOSIS — K224 Dyskinesia of esophagus: Secondary | ICD-10-CM | POA: Diagnosis not present

## 2017-11-23 DIAGNOSIS — R1310 Dysphagia, unspecified: Secondary | ICD-10-CM | POA: Diagnosis not present

## 2017-11-23 DIAGNOSIS — Z9049 Acquired absence of other specified parts of digestive tract: Secondary | ICD-10-CM | POA: Diagnosis not present

## 2017-11-23 NOTE — Therapy (Addendum)
[facility] [place], [place], [address] Phone: [phone]   Fax:     Modified Barium Swallow  Patient Details  Name: Lynn Vasquez MRN: [id number] Date of Birth: 07/21/1926 No data recorded  Encounter Date: 11/23/2017  End of Session - 11/23/17 1443    Visit Number  1    Number of Visits  1    Date for SLP Re-Evaluation  11/23/17    SLP Start Time  1245    SLP Stop Time   1400    SLP Time Calculation (min)  75 min    Activity Tolerance  Patient tolerated treatment well       Past Medical History:  Diagnosis Date  . Full code status 11/07/2016   Discussed November 07, 2016; do try resuscitative measures if needed; however, patient does not wish to be maintained on life support if terminably incurable or cancer  . History of skin cancer   . Pinched nerve   . Pneumonia   . Vertigo     Past Surgical History:  Procedure Laterality Date  . BREAST LUMPECTOMY Right    1954- benign  . BREAST SURGERY Right [id number]  . CATARACT EXTRACTION, BILATERAL Bilateral 2013-2014  . CHOLECYSTECTOMY    . FINGER SURGERY Right    removed spur  . goiter surgery    . HAND SURGERY    . SKIN CANCER EXCISION Right    leg  . TOE SURGERY Right    removed bone in little toe  . TONSILLECTOMY    . VARICOSE VEIN SURGERY Right     There were no vitals filed for this visit.        Subjective: Patient behavior: (alertness, ability to follow instructions, etc.): pt verbally conversive; followed instructions appropriately. She gave full details of medical history. Pt denied any neurological history; No pneumonia. She is not on any PPIs. Native dentition.  Chief complaint: dysphagia. She endorsed difficulty w/ large Pills and Solids. She had a h/o Thyroid nodule w/ surgery ~50 years ago. She denies any overt coughing or choking when eating/drinking at meals.   Objective:  Radiological Procedure: A videoflouroscopic  evaluation of oral-preparatory, reflex initiation, and pharyngeal phases of the swallow was performed; as well as a screening of the upper esophageal phase.  I. POSTURE: upright II. VIEW: lateral III. COMPENSATORY STRATEGIES: none indicated IV. BOLUSES ADMINISTERED:  Thin Liquid: multiple sips via cup, straw (~3 each)  Nectar-thick Liquid: 1 sip/trial  Honey-thick Liquid: NT  Puree: 2 trials  Mechanical Soft: 2 trials  Barium Tablet: 1 tablet w/ tsp of puree V. RESULTS OF EVALUATION: A. ORAL PREPARATORY PHASE: (The lips, tongue, and velum are observed for strength and coordination)       **Overall Severity Rating: WFL. Noted timely oral phase w/ appropriate bolus management during mastication, appropriate bolus cohesion, and timely A-P transfer. Oral clearing achieved b/t trials. Intermittent bolus piecemealing noted w/ involuntary f/u swallow to finish clearing.   B. SWALLOW INITIATION/REFLEX: (The reflex is normal if "triggered" by the time the bolus reached the base of the tongue)  **Overall Severity Rating: [facility]. All pharyngeal swallowing appeared to trigger at the level of the BOT-valleculae. Full airway closure noted during the swallowing.  C. PHARYNGEAL PHASE: (Pharyngeal function is normal if the bolus shows rapid, smooth, and continuous transit through the pharynx and there is no pharyngeal residue after the swallow)  **Overall Severity Rating: [facility]. Pt adequately cleared the pharynx of bolus residue post  swallowing indicating adequate pharyngeal pressure and laryngeal excursion during the swallowing.   D. LARYNGEAL PENETRATION: (Material entering into the laryngeal inlet/vestibule but not aspirated): NONE  E. ASPIRATION: NONE F. ESOPHAGEAL PHASE: (Screening of the upper esophagus): Esophageal dysmotility was noted during the presentation of the Barium tablet as well as during bolus motility w/ a Puree trial in the lower Cervical Esophagus. Pt utilized strategies of Time and  Alternating w/ sips of liquid to help "wash" or clear the Barium tablet. It was noted to move slowly requiring multiple sips of liquid, but it cleared eventually.   ASSESSMENT: Pt appears to present w/ adequate oropharyngeal phase swallowing function w/ no oropharyngeal dysphagia noted during this exam. During the Oral phase, pt demonstrated a timely oral phase w/ appropriate bolus management during mastication, appropriate bolus cohesion, and timely A-P transfer. Oral clearing was achieved b/t trials. Intermittent bolus piecemealing occurred w/ an involuntary, f/u swallow to finish clearing any residue noted. During the Pharyngeal phase, pt exhibited a timely pharyngeal swallow initiation; pharyngeal swallowing appeared to trigger at the level of the BOT-valleculae. Full airway closure noted during the swallowing. Pt adequately cleared the pharynx of bolus residue post swallowing indicating adequate pharyngeal pressure and laryngeal excursion during the swallowing. NO laryngeal penetration or aspiration was noted during this exam. However, during the Esophageal phase, Esophageal dysmotility was noted during the presentation of the Barium tablet as well as during bolus motility w/ a Puree trial (occurring in the lower Cervical Esophagus). Pt utilized strategies of Time and Alternating w/ sips of liquid to help "wash" or clear the Barium tablet of the Mid and Distal Esophagus. The Barium tablet was noted to move slowly requiring multiple sips of liquid before eventual clearing occurred. Suspect this presentation could be related to pt's c/o difficulty when swallowing Large Pills and Solids at home - pt was able to point to the Mid-sternum area to identify where she felt the discomfort typically. Unsure if the Esophageal dysmotility is creating any further impact or feelings of discomfort more superiorly. Education given to both pt and Daughter on the results of this study; Esophageal dysmotility and its impact.  Encouraged pt to f/u w/ ENT to discuss further any concerns re: her Thyroid. Also encouraged pt and Daughter to discuss w/ PCP, ENT, and/or GI the topic of Esophageal dysmotility to include potential presbyesophagus(suspected?). Recommended monitoring foods that are problematic but in general cut all foods small, moisten well, and give Time b/t bites/sips during meals to allow for Esophageal clearing. Discussed the use of Applesauce, Yogurt, ice cream for aiding swallowing large Pills(whole). Pt and Daughter agreed.      PLAN/RECOMMENDATIONS:  A. Diet: Mech Soft diet consistency (meats, foods cut small; moistened well); thin liquids. Pills Whole in Puree for better comfort when swallowing   B. Swallowing Precautions: general aspiration precautions; REFLUX precautions  C. Recommended consultation to: GI for further Esophageal motility assessment and treatment  D. Therapy recommendations: None  E. Results and recommendations were discussed w/ pt, Daughter; video viewed and discussed          Dysphagia, unspecified type - Plan: DG OP Swallowing Func-Medicare/Speech Path, DG OP Swallowing Func-Medicare/Speech Path        Problem List Patient Active Problem List   Diagnosis Date Noted  . Osteopenia 06/21/2017  . Headache 04/24/2017  . Family history of dementia 04/20/2017  . Vitamin B12 deficiency 12/19/2016  . Anemia 11/07/2016  . Post-surgical hypothyroidism 11/07/2016  . Hair loss 11/07/2016  . Full code  status 11/07/2016     Orinda Kenner, Gisela, CCC-SLP Watson,Katherine 11/23/2017, 2:45 PM  [facility] [place], [place], [address] Phone: [phone]   Fax:     Name: Lynn Vasquez MRN: [id number] Date of Birth: 07/09/1926

## 2017-11-26 ENCOUNTER — Telehealth: Payer: Self-pay | Admitting: Family Medicine

## 2017-11-26 DIAGNOSIS — R1310 Dysphagia, unspecified: Secondary | ICD-10-CM | POA: Insufficient documentation

## 2017-11-26 NOTE — Telephone Encounter (Signed)
Results: PLAN/RECOMMENDATIONS:             A. Diet: Mech Soft diet consistency (meats, foods cut small; moistened well); thin liquids. Pills Whole in Puree for better comfort when swallowing              B. Swallowing Precautions: general aspiration precautions; REFLUX precautions             C. Recommended consultation to: GI for further Esophageal motility assessment and treatment             D. Therapy recommendations: None             E. Results and recommendations were discussed w/ pt, Daughter; video viewed and discussed   Dysphagia, unspecified type - Plan: DG OP Swallowing Func-Medicare/Speech Path, DG OP Swallowing Func-Medicare/Speech Path  ------------------------------------------------------- Contact patient, make sure she is practicing the above recommendations Please REFER to GI, dx: dysphagia

## 2017-11-27 ENCOUNTER — Telehealth: Payer: Self-pay | Admitting: Family Medicine

## 2017-11-27 NOTE — Telephone Encounter (Signed)
Why was the GI referral canceled?

## 2017-11-27 NOTE — Telephone Encounter (Signed)
Pt stated that she did not need a referral because she has already been seen and evaluated by a GI doctor, see previous note.

## 2017-11-27 NOTE — Telephone Encounter (Signed)
Pt has already seen GI and been evaluated. They told her that her esophagus doesn't push food down like it should but there is no need for surgery or a follow up. They told her to just continue to be careful.

## 2018-02-21 DIAGNOSIS — Z961 Presence of intraocular lens: Secondary | ICD-10-CM | POA: Diagnosis not present

## 2018-06-01 ENCOUNTER — Ambulatory Visit: Payer: Medicare Other

## 2018-09-13 ENCOUNTER — Ambulatory Visit: Payer: Medicare Other

## 2018-11-16 ENCOUNTER — Ambulatory Visit: Payer: Medicare Other

## 2019-07-09 IMAGING — CR DG HIP (WITH OR WITHOUT PELVIS) 2-3V*L*
1 series · 3 of 3 positions shown · non-contrast
Comparison: None.

CLINICAL DATA: Generalized left hip pain for the past 2-3 weeks
with no known injury.

EXAM:
DG HIP (WITH OR WITHOUT PELVIS) 2-3V LEFT

[Series 1: dg hip unilat w or w/o pelvis 2-3 views  · non-contrast · 0.14mm/px · 3 of 3 slices shown]
[im 1/3]
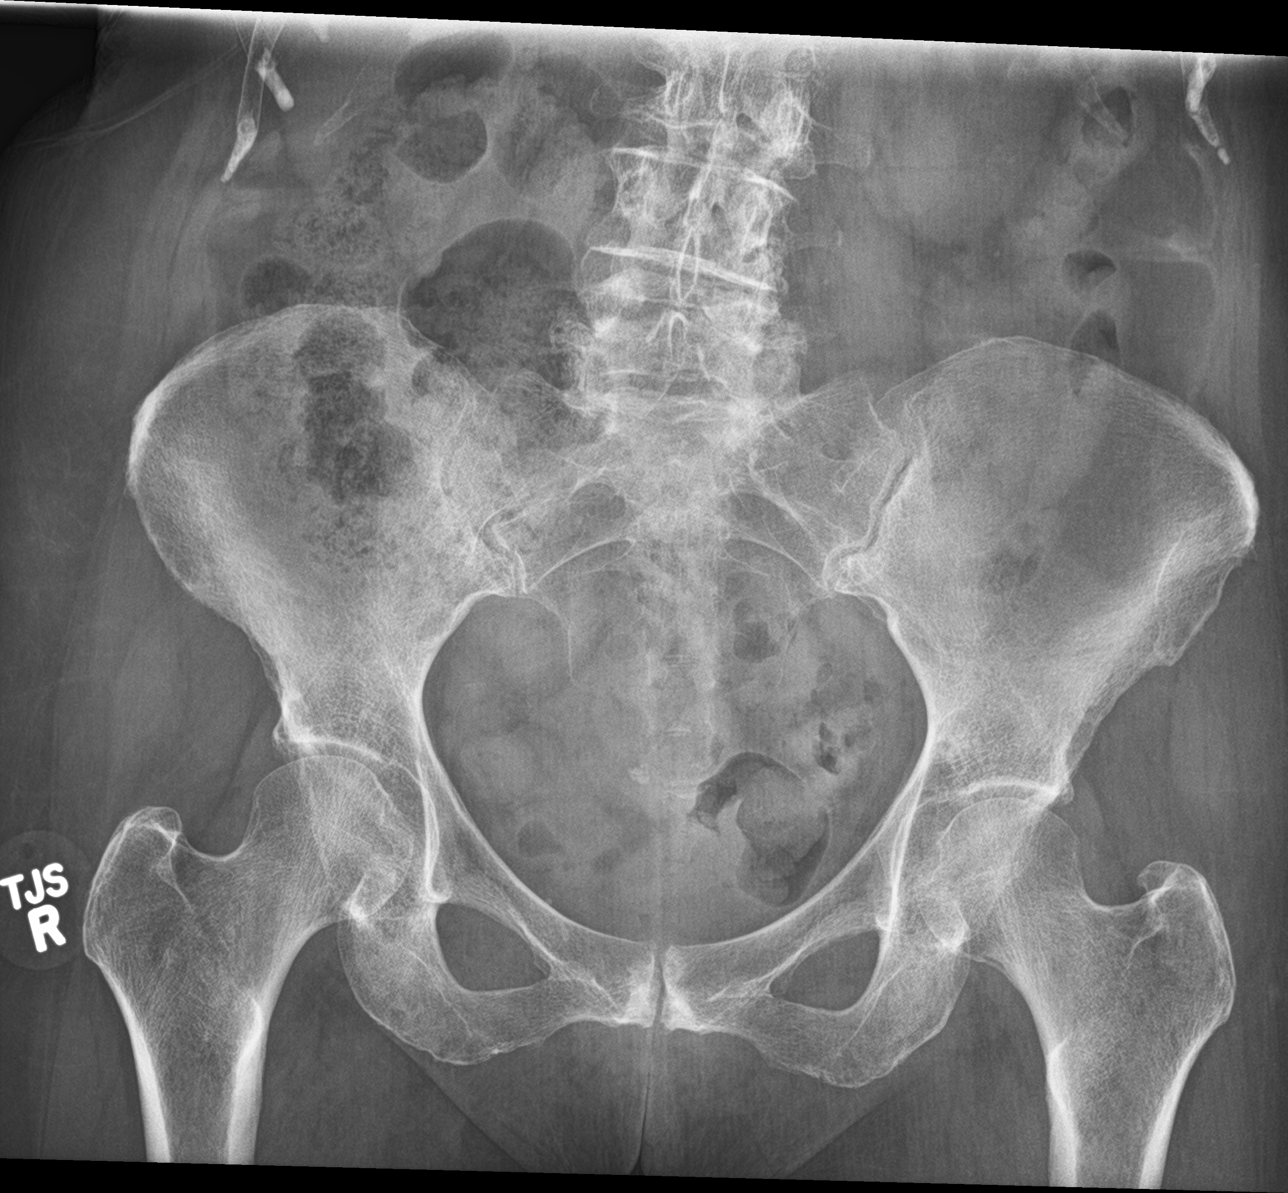
[im 2/3]
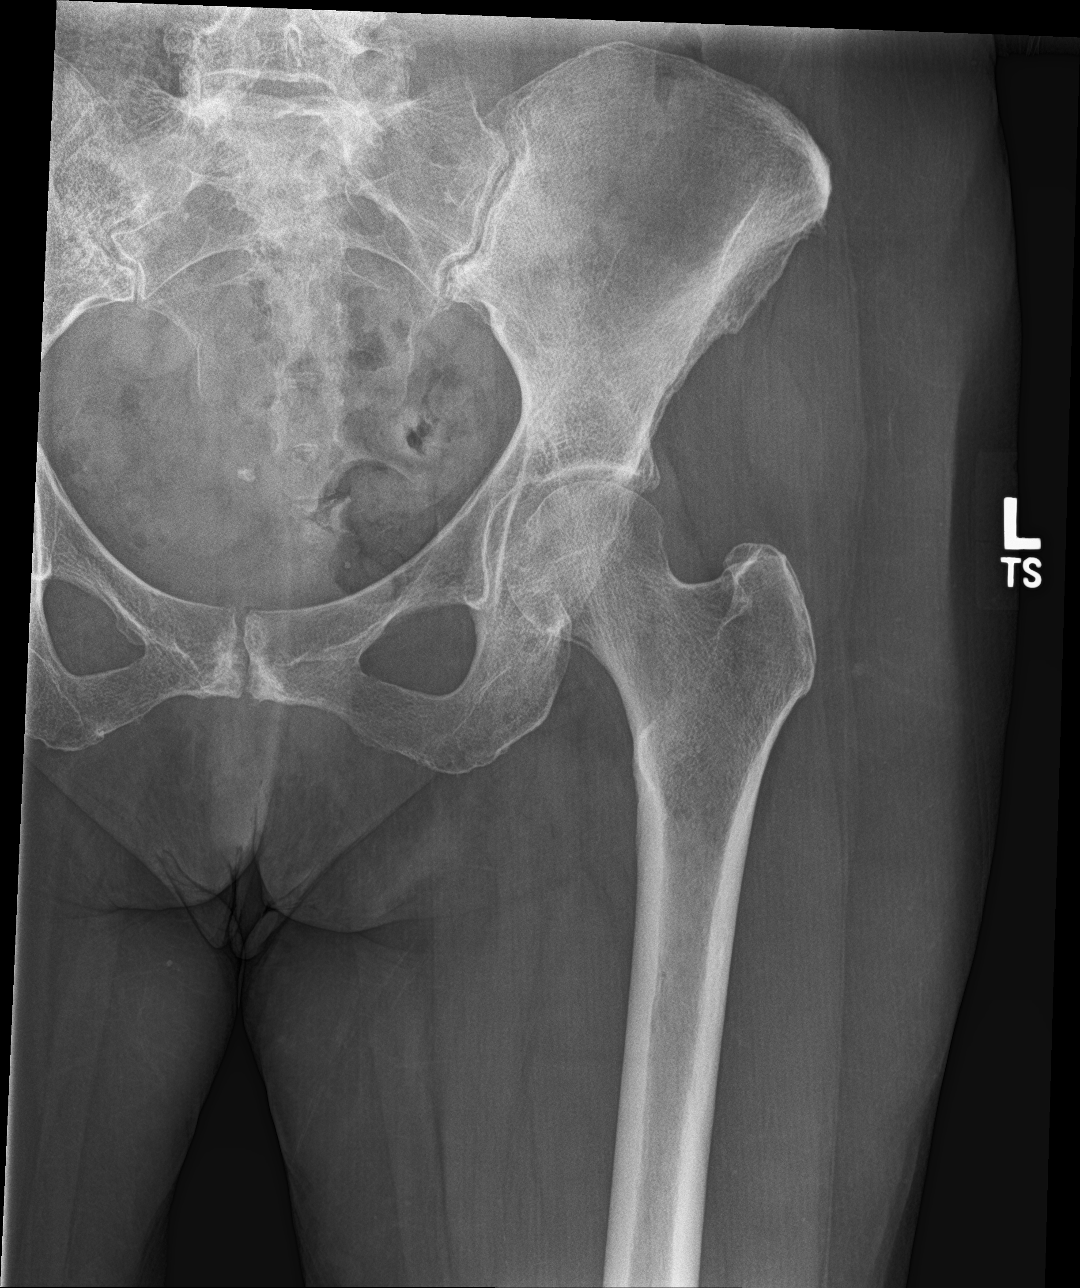
[im 3/3]
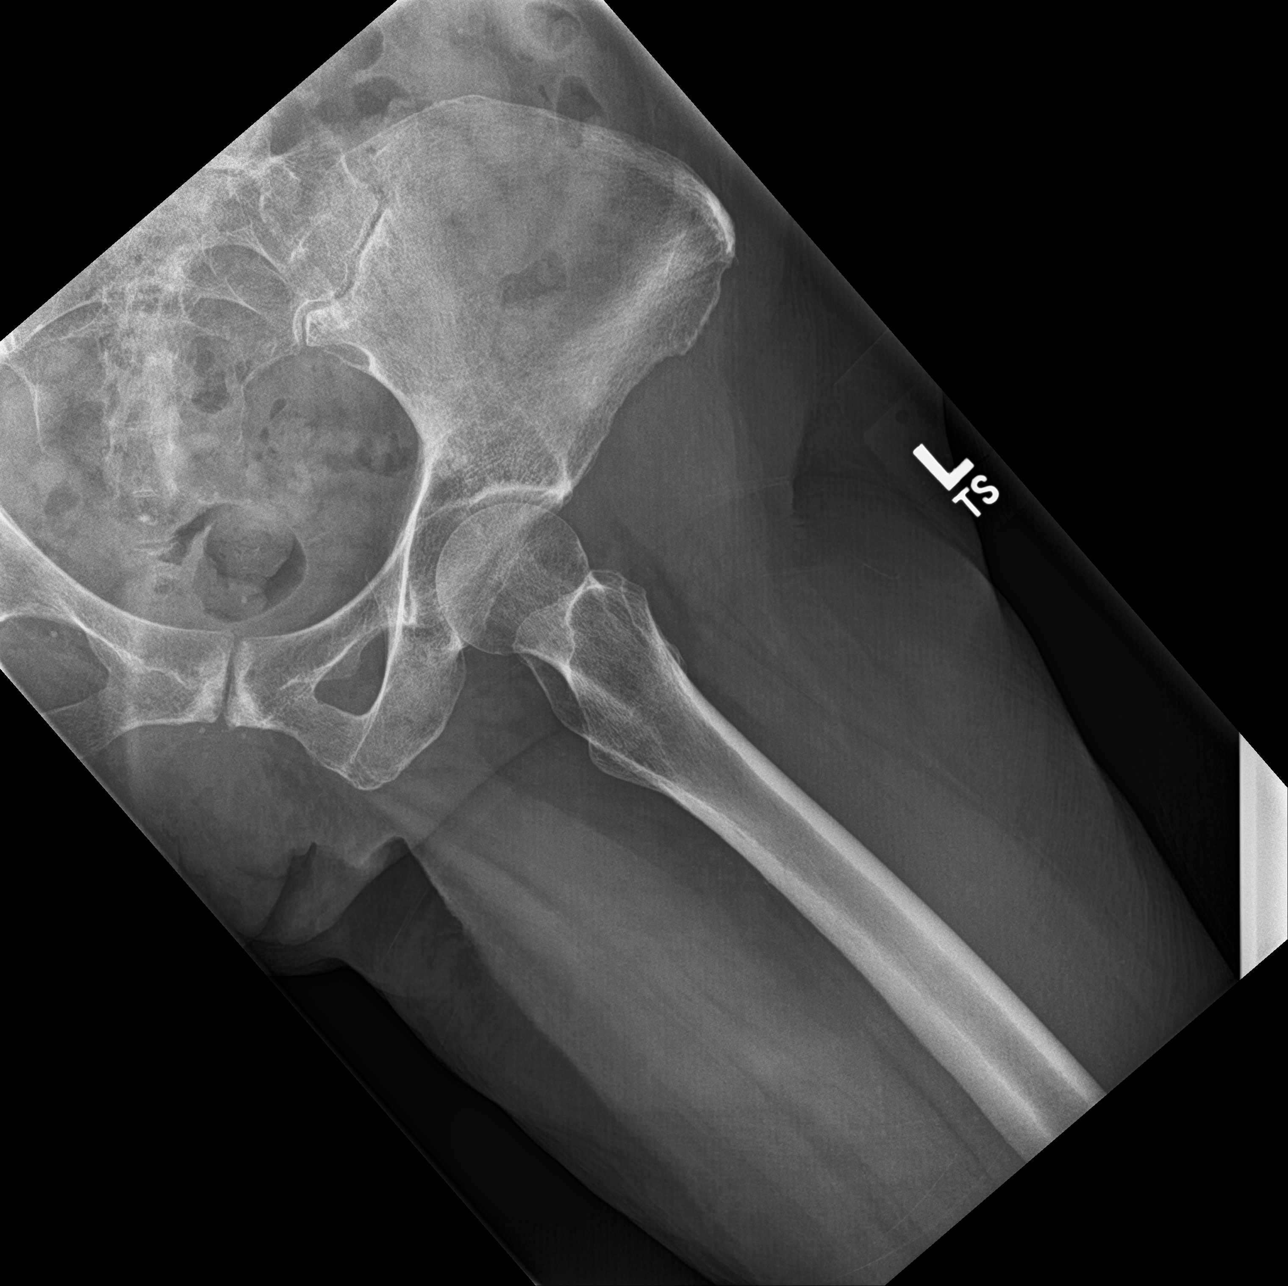

[3 of 3 positions shown; findings below may reference images not displayed]

FINDINGS: The bony pelvis is subjectively osteopenic. There is no lytic nor
blastic lesion nor acute fracture. AP and lateral views of the left
hip reveal preservation of the joint space. The articular surfaces
of the femoral head and acetabulum remains smoothly rounded. The
femoral neck, intertrochanteric, and subtrochanteric regions are
normal.
IMPRESSION: There is no acute or significant chronic bony abnormality of the
left hip.

## 2019-08-09 IMAGING — RF DG SWALLOWING FUNCTION
1 series · 1 of 1 positions shown · non-contrast
Comparison: None.

CLINICAL DATA: Dysphagia

EXAM:
MODIFIED BARIUM SWALLOW
TECHNIQUE: Different consistencies of barium were administered orally to the
patient by the Speech Pathologist. Imaging of the pharynx was
performed in the lateral projection.
FLUOROSCOPY TIME:  Fluoroscopy Time:  1.1 minute
Radiation Exposure Index (if provided by the fluoroscopic device):
3.6 mGy
Number of Acquired Spot Images: 0

[Series 1: cp_standard · 0.25mm/px · 1 of 1 slices shown]
[im 1/1]
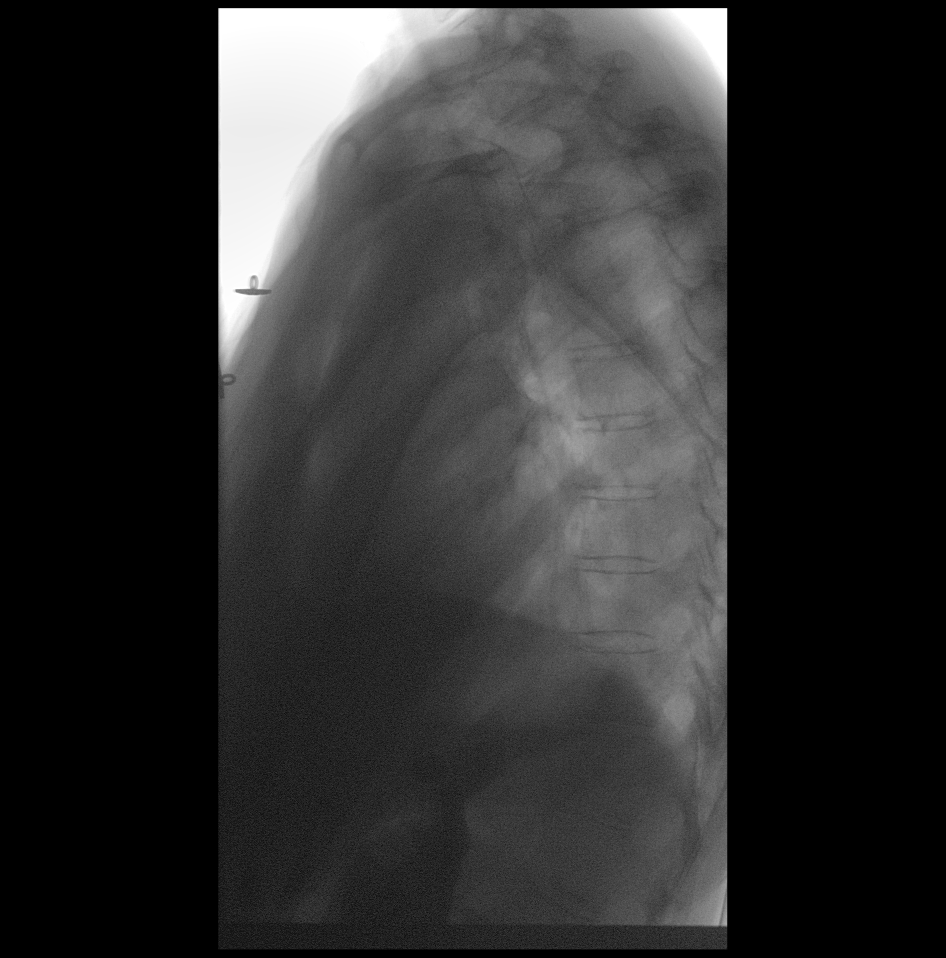

[1 of 1 positions shown; findings below may reference images not displayed]

FINDINGS: Thin liquid- within normal limits

Honey thick liquid-within normal limits

Pure- within normal limits

Pure with cracker- within normal limits

Barium tablet -  within normal limits
IMPRESSION: Modified barium swallow as described above.

Please refer to the Speech Pathologists report for complete details
and recommendations.

## 2019-10-05 IMAGING — US US THYROID
1 series · 13 of 25 positions shown · non-contrast
Comparison: None.

CLINICAL DATA: Nodule.  History of remote partial thyroidectomy

EXAM:
THYROID ULTRASOUND
TECHNIQUE: Ultrasound examination of the thyroid gland and adjacent soft
tissues was performed.

[Series 1: us thyroid · 0.05mm/px · 13 of 53 slices shown]
[im 1/53]
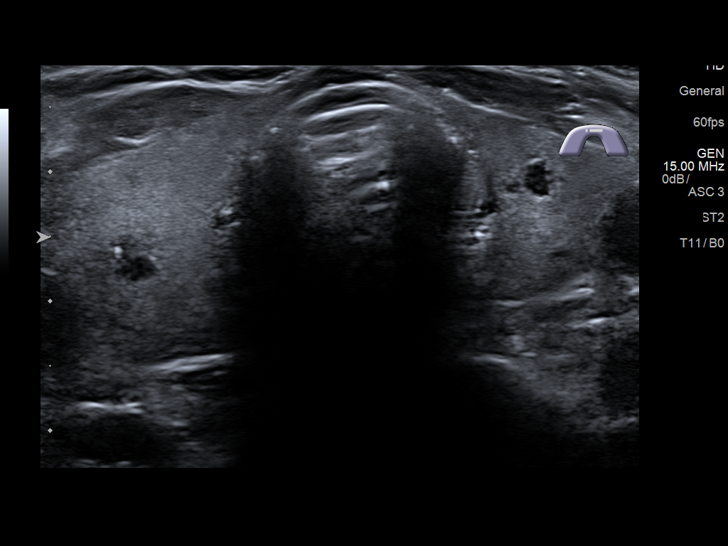
[im 5/53]
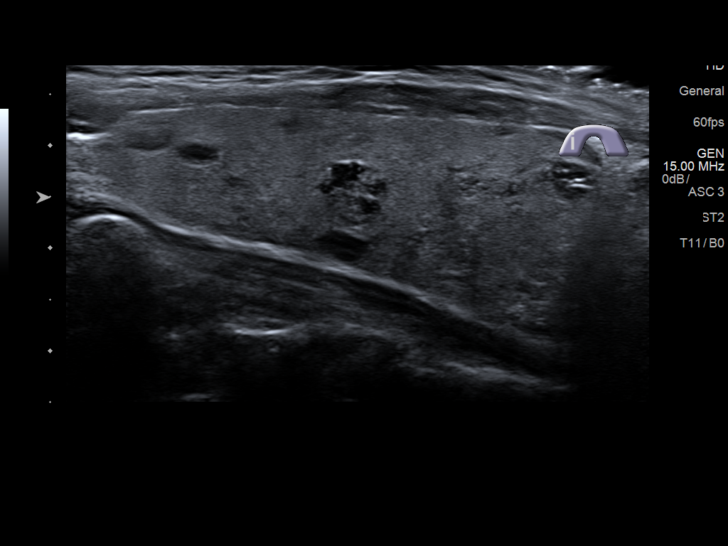
[im 9/53]
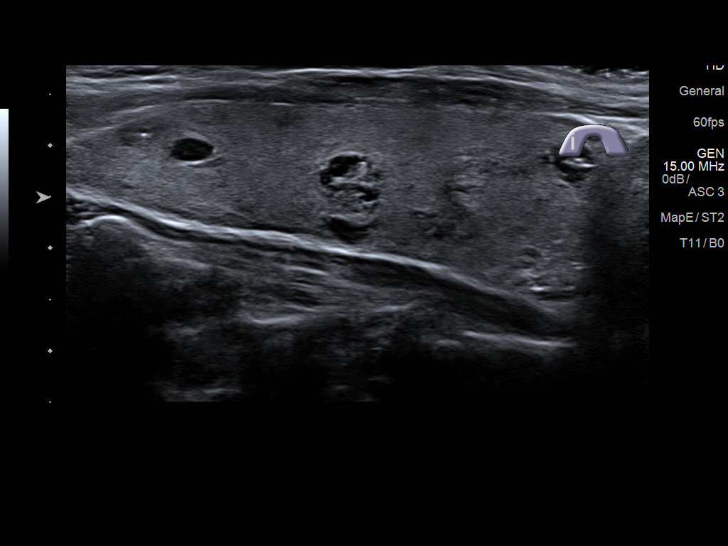
[im 14/53]
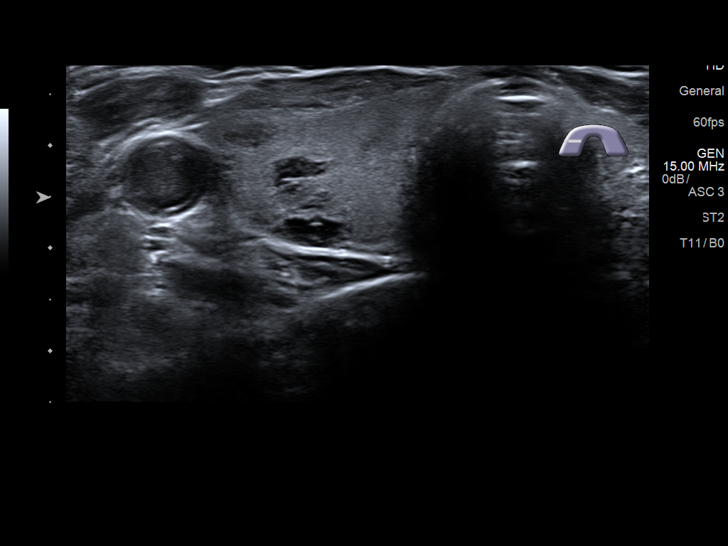
[im 18/53]
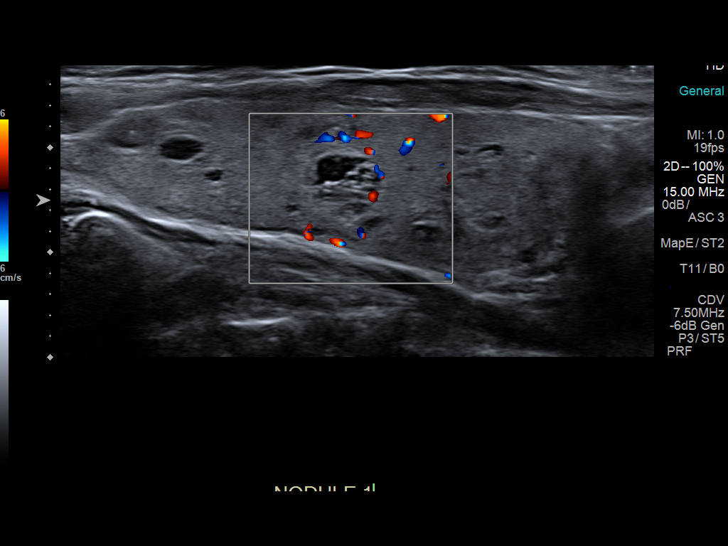
[im 22/53]
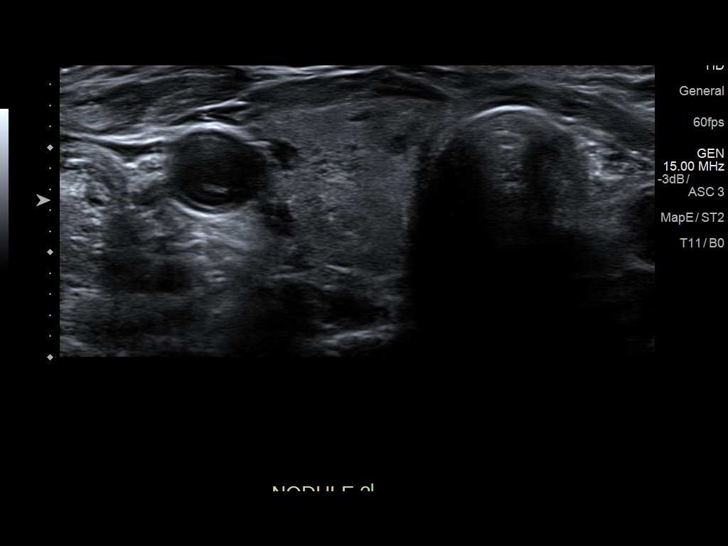
[im 27/53]
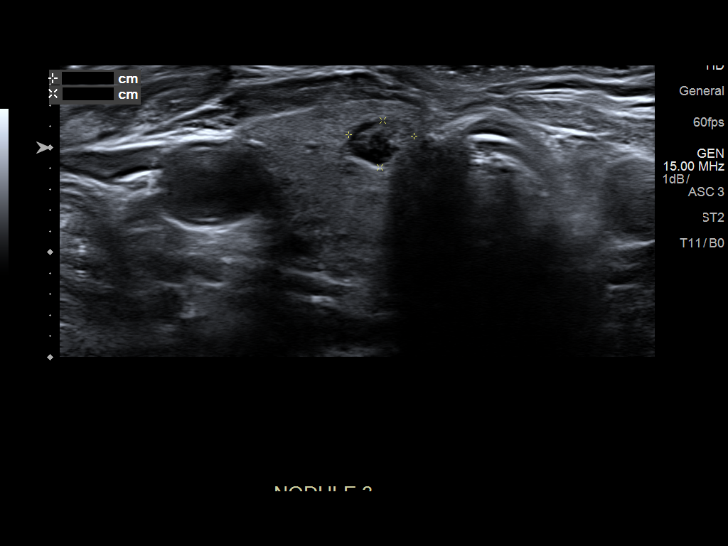
[im 31/53]
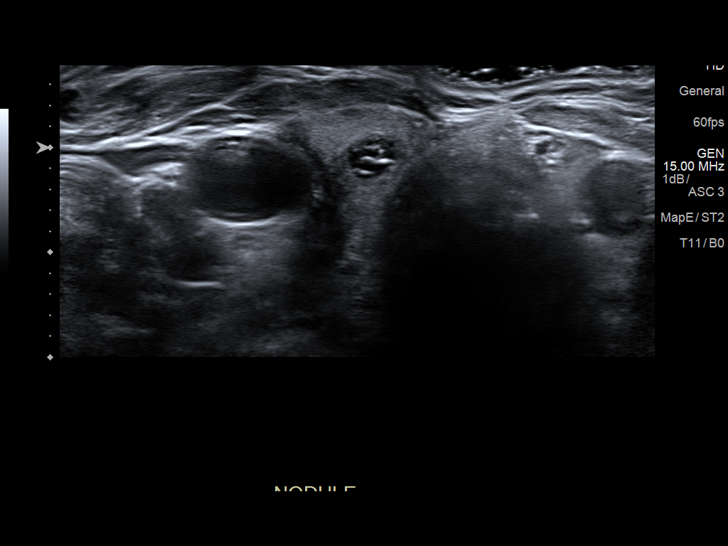
[im 35/53]
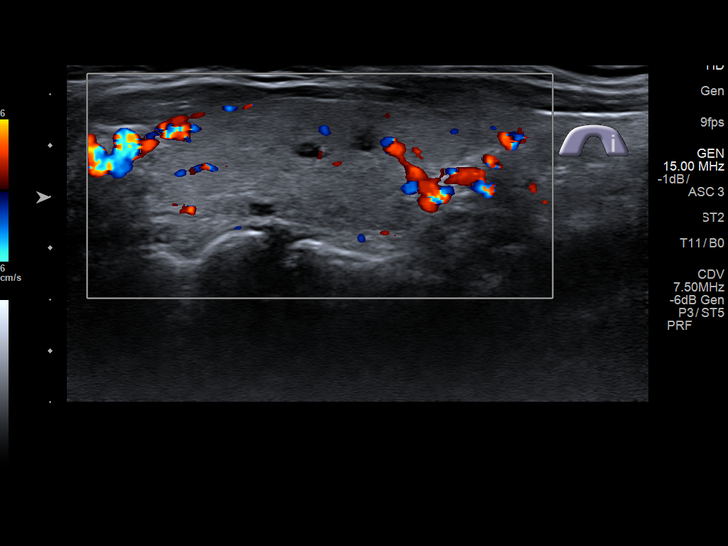
[im 40/53]
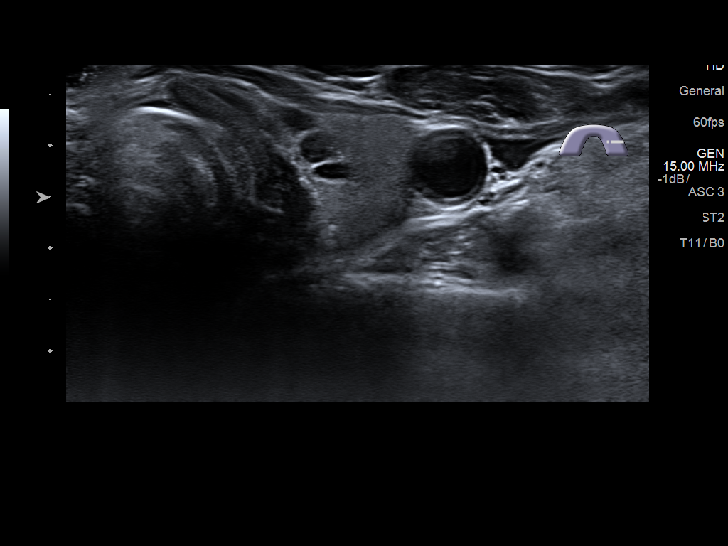
[im 44/53]
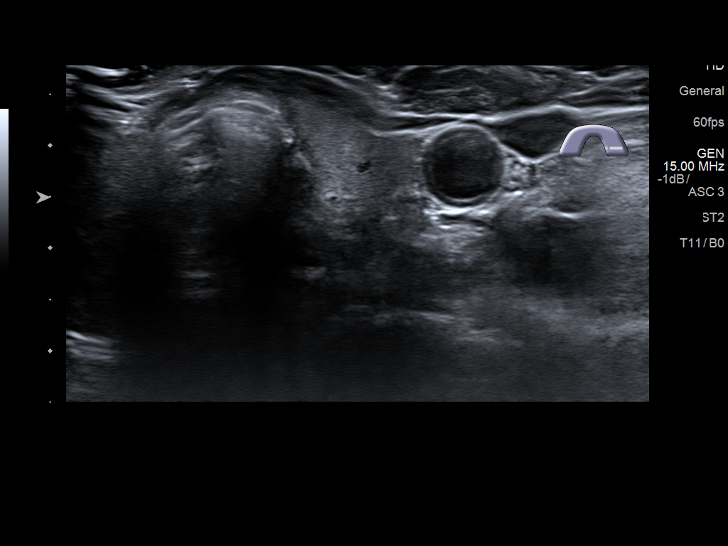
[im 48/53]
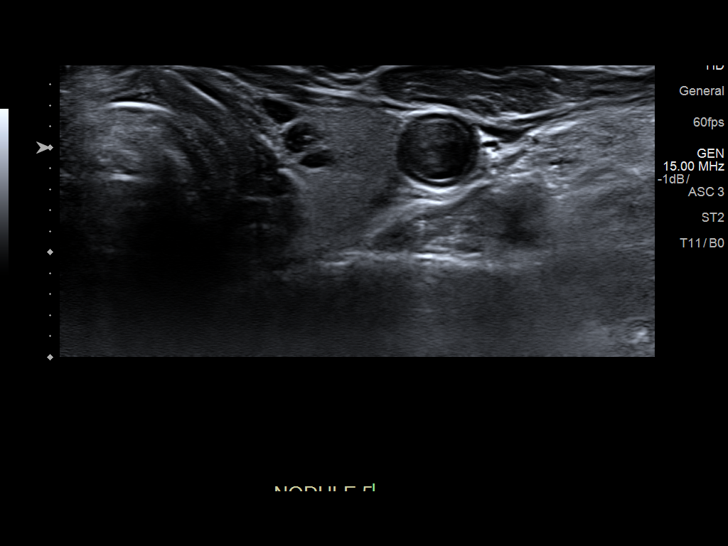
[im 53/53]
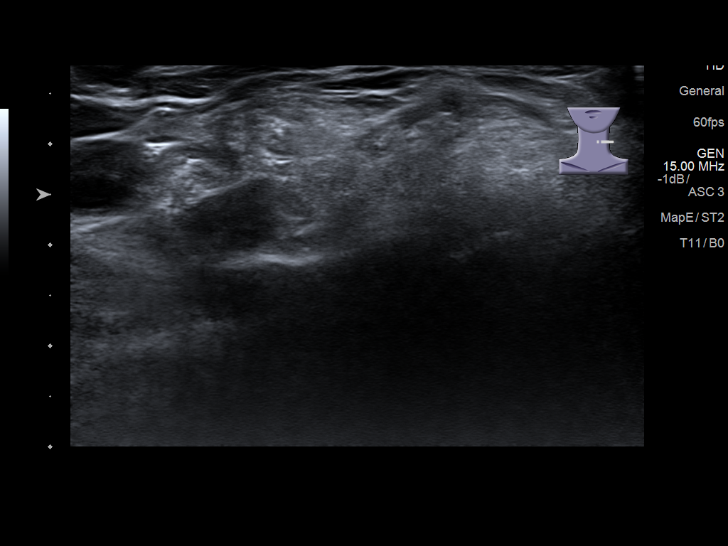

[13 of 25 positions shown; findings below may reference images not displayed]

FINDINGS: Parenchymal Echotexture: Mildly heterogenous

Isthmus: 0.2 cm thickness

Right lobe: 5.1 x 2 x 1.6 cm

Left lobe: 3.8 x 1.2 x 1.3 cm

_________________________________________________________

Estimated total number of nodules >/= 1 cm: 1

Number of spongiform nodules >/=  2 cm not described below (TR1): 0

Number of mixed cystic and solid nodules >/= 1.5 cm not described
below (TR2): 0

_________________________________________________________

Nodule # 2:

Location: Right; Inferior

Maximum size: 1.2 cm; Other 2 dimensions: 1.2 x 1.1 cm

Composition: solid/almost completely solid (2)

Echogenicity: isoechoic (1)

Shape: taller-than-wide (3)

Margins: ill-defined (0)

Echogenic foci: none (0)

ACR TI-RADS total points: 6.

ACR TI-RADS risk category: TR4 (4-6 points).

ACR TI-RADS recommendations:

*Given size (>/= 1 - 1.4 cm) and appearance, a follow-up ultrasound
in 1 year should be considered based on TI-RADS criteria.

_________________________________________________________

Additional scattered bilateral subcentimeter complex cystic nodules;
these do NOT meet TI-RADS criteria for biopsy or dedicated
follow-up.
IMPRESSION: 1. Mild asymmetric thyromegaly with scattered nodules. None meets
criteria for biopsy.
2. Recommend annual/biennial ultrasound follow-up inferior right
cm nodule, until stability x5 years confirmed.

The above is in keeping with the ACR TI-RADS recommendations - [HOSPITAL] 5804;[DATE].

## 2020-11-26 ENCOUNTER — Other Ambulatory Visit: Payer: Self-pay | Admitting: Family Medicine

## 2020-11-26 DIAGNOSIS — R221 Localized swelling, mass and lump, neck: Secondary | ICD-10-CM

## 2020-12-15 ENCOUNTER — Ambulatory Visit: Payer: Medicare Other

## 2021-07-24 ENCOUNTER — Other Ambulatory Visit: Payer: Self-pay

## 2021-07-24 ENCOUNTER — Emergency Department
Admission: EM | Admit: 2021-07-24 | Discharge: 2021-07-24 | Disposition: A | Discharge status: Home / Self Care | Payer: Medicare Other | Attending: Emergency Medicine | Admitting: Emergency Medicine

## 2021-07-24 ENCOUNTER — Encounter: Payer: Self-pay | Admitting: Emergency Medicine

## 2021-07-24 ENCOUNTER — Emergency Department: Payer: Medicare Other

## 2021-07-24 DIAGNOSIS — W010XXA Fall on same level from slipping, tripping and stumbling without subsequent striking against object, initial encounter: Secondary | ICD-10-CM | POA: Insufficient documentation

## 2021-07-24 DIAGNOSIS — Y9301 Activity, walking, marching and hiking: Secondary | ICD-10-CM | POA: Diagnosis not present

## 2021-07-24 DIAGNOSIS — M545 Low back pain, unspecified: Secondary | ICD-10-CM | POA: Insufficient documentation

## 2021-07-24 DIAGNOSIS — Y92008 Other place in unspecified non-institutional (private) residence as the place of occurrence of the external cause: Secondary | ICD-10-CM | POA: Diagnosis not present

## 2021-07-24 DIAGNOSIS — S0990XA Unspecified injury of head, initial encounter: Secondary | ICD-10-CM | POA: Diagnosis present

## 2021-07-24 DIAGNOSIS — M25551 Pain in right hip: Secondary | ICD-10-CM | POA: Insufficient documentation

## 2021-07-24 DIAGNOSIS — M546 Pain in thoracic spine: Secondary | ICD-10-CM | POA: Insufficient documentation

## 2021-07-24 DIAGNOSIS — M549 Dorsalgia, unspecified: Secondary | ICD-10-CM

## 2021-07-24 DIAGNOSIS — W19XXXA Unspecified fall, initial encounter: Secondary | ICD-10-CM

## 2021-07-24 LAB — BASIC METABOLIC PANEL
Anion gap: 5 (ref 5–15)
BUN: 23 mg/dL (ref 8–23)
CO2: 25 mmol/L (ref 22–32)
Calcium: 8.5 mg/dL — ABNORMAL LOW (ref 8.9–10.3)
Chloride: 105 mmol/L (ref 98–111)
Creatinine, Ser: 0.7 mg/dL (ref 0.44–1.00)
GFR, Estimated: >60 mL/min (ref >60)
Glucose, Bld: 131 mg/dL — ABNORMAL HIGH (ref 70–99)
Potassium: 4 mmol/L (ref 3.5–5.1)
Sodium: 135 mmol/L (ref 135–145)

## 2021-07-24 LAB — CBC WITH DIFFERENTIAL/PLATELET
Abs Immature Granulocytes: 0.04 10*3/uL (ref 0.00–0.07)
Basophils Absolute: 0 10*3/uL (ref 0.0–0.1)
Basophils Relative: 1 %
Eosinophils Absolute: 0.2 10*3/uL (ref 0.0–0.5)
Eosinophils Relative: 3 %
HCT: 35.9 % — ABNORMAL LOW (ref 36.0–46.0)
Hemoglobin: 11.5 g/dL — ABNORMAL LOW (ref 12.0–15.0)
Immature Granulocytes: 1 %
Lymphocytes Relative: 23 %
Lymphs Abs: 1.2 10*3/uL (ref 0.7–4.0)
MCH: 28.7 pg (ref 26.0–34.0)
MCHC: 32 g/dL (ref 30.0–36.0)
MCV: 89.5 fL (ref 80.0–100.0)
Monocytes Absolute: 0.5 10*3/uL (ref 0.1–1.0)
Monocytes Relative: 9 %
Neutro Abs: 3.4 10*3/uL (ref 1.7–7.7)
Neutrophils Relative %: 63 %
Platelets: 233 10*3/uL (ref 150–400)
RBC: 4.01 MIL/uL (ref 3.87–5.11)
RDW: 13.4 % (ref 11.5–15.5)
WBC: 5.3 10*3/uL (ref 4.0–10.5)
nRBC: 0 % (ref 0.0–0.2)

## 2021-07-24 LAB — URINALYSIS, ROUTINE W REFLEX MICROSCOPIC
Bilirubin Urine: NEGATIVE
Glucose, UA: NEGATIVE mg/dL
Ketones, ur: NEGATIVE mg/dL
Nitrite: NEGATIVE
Protein, ur: NEGATIVE mg/dL
Specific Gravity, Urine: 1.012 (ref 1.005–1.030)
Squamous Epithelial / LPF: NONE SEEN (ref 0–5)
pH: 5 (ref 5.0–8.0)

## 2021-07-24 LAB — TROPONIN I (HIGH SENSITIVITY): Troponin I (High Sensitivity): 5 ng/L (ref <18)

## 2021-07-24 MED ORDER — ACETAMINOPHEN 500 MG PO TABS
1000.0000 mg | ORAL_TABLET | Freq: Once | ORAL | Status: AC
  Administered 2021-07-24: 1000 mg via ORAL
  Filled 2021-07-24: qty 2

## 2021-07-24 MED ORDER — ACETAMINOPHEN 325 MG PO TABS: 650.0000 mg | ORAL_TABLET | Freq: Four times a day (QID) | ORAL | 0 refills | Status: AC | PRN

## 2021-07-24 NOTE — ED Provider Notes (Signed)
[facility] Provider Note    Event Date/Time   First MD Initiated Contact with Patient 07/24/21 1724     (approximate)   History   Fall, Back Pain, and Hip Pain   HPI  Lynn Vasquez is a 88 y.o. female with past medical history of Mild osteopenia, hypothyroidism, here with fall.  The patient is currently recovering from a sprained ankle.  She just had her boot taken off about 3 days ago.  She has been moving decently well since then.  She states she was walking laps in her house when she believes she tripped, causing her to fall.  She does not necessarily remember exactly what made her fall.  She did not lose consciousness.  Denies any significant head injury.  Reports that she has pain in her thoracic and lower back after the fall as well as mild pain in her right hip.  She has, however, been able to get herself back up and ambulate.  No lower extremity weakness or numbness.  No loss of bowel or bladder function.  Her ankle is slightly more painful after the fall.     Physical Exam   Triage Vital Signs: ED Triage Vitals  Enc Vitals Group     BP 07/24/21 1715 139/72     Pulse Rate 07/24/21 1715 76     Resp 07/24/21 1715 17     Temp 07/24/21 1715 98.2 F (36.8 C)     Temp src --      SpO2 07/24/21 1715 100 %     Weight 07/24/21 1710 127 lb 13.9 oz (58 kg)     Height 07/24/21 1710 '5\' 4"'$  (1.626 m)     Head Circumference --      Peak Flow --      Pain Score 07/24/21 1710 6     Pain Loc --      Pain Edu? --      Excl. in [place]? --     Most recent vital signs: Vitals:   07/24/21 1715 07/24/21 2014  BP: 139/72 (!) 159/87  Pulse: 76 74  Resp: 17 18  Temp: 98.2 F (36.8 C)   SpO2: 100% 100%     General: Awake, no distress.  CV:  Good peripheral perfusion. RRR. No murmurs. Resp:  Normal effort. Lungs CTAB. No w/r/r. Abd:  No distention. No tenderness. Other:  Mild TTP over paraspinal thoracic and lumbar spine, no midline deformity, no step offs.  No LE weakness or numbness. Strength 5/5 b/l LE. Mild swelling of the R ankle, particularly lateral malleolus, but no significant tenderness. No open wounds.   ED Results / Procedures / Treatments   Labs (all labs ordered are listed, but only abnormal results are displayed) Labs Reviewed  CBC WITH DIFFERENTIAL/PLATELET - Abnormal; Notable for the following components:      Result Value   Hemoglobin 11.5 (*)    HCT 35.9 (*)    All other components within normal limits  BASIC METABOLIC PANEL - Abnormal; Notable for the following components:   Glucose, Bld 131 (*)    Calcium 8.5 (*)    All other components within normal limits  URINALYSIS, ROUTINE W REFLEX MICROSCOPIC - Abnormal; Notable for the following components:   Color, Urine YELLOW (*)    APPearance CLEAR (*)    Hgb urine dipstick SMALL (*)    Leukocytes,Ua TRACE (*)    Bacteria, UA RARE (*)    All other components within normal limits  TROPONIN I (HIGH SENSITIVITY)     EKG Normal sinus rhythm with PACs.  Ventricular rate 69.  PR 170, QRS 78, QTc 432.  No acute ST elevations or depressions.   RADIOLOGY CT head/C-spine: No acute abnormality CT T/L-spine: Degenerative changes, mild retrolisthesis, no acute abnormality DG ankle right: Negative DG hip right: Negative   I also independently reviewed and agree with radiologist interpretations.   PROCEDURES:  Critical Care performed: No  MEDICATIONS ORDERED IN ED: Medications  acetaminophen (TYLENOL) tablet 1,000 mg (1,000 mg Oral Given 07/24/21 1858)     IMPRESSION / MDM / ASSESSMENT AND PLAN / ED COURSE  I reviewed the triage vital signs and the nursing notes.                               The patient is on the cardiac monitor to evaluate for evidence of arrhythmia and/or significant heart rate changes.   Ddx:  Differential includes the following, with pertinent life- or limb-threatening emergencies considered:  Mechanical fall with musculoskeletal back  pain, likely related to chronic degenerative changes, radiculopathy, compression fracture, possible fall due to anemia, dehydration, UTI or occult infection  Patient's presentation is most consistent with acute presentation with potential threat to life or bodily function.  MDM:  88 yo well appearing female here with fall, back and hip pain. Fall seems mechanical in nature 2/2 her ankle sprain/injury, but given somewhat unclear nature of fall, basic labs/ekg obtained. CBC without leukocytosis or clinically significant anemia. BMP unremarkable. Trop negative, EKG nonischemic. UA negative for UTI. CT Head/C spine negative. CT T/L spine show degenerative changes but no fx. Plain films negative.  Likely MSK pain in setting of mechanical fall. Pt is ambulatory at baseline in ED, and family confirms she is at her baseline. Advised her to schedule OTC analgesics for next 2-3 days, and use a walker/cane at home until she feels steady. Return precautions given.   MEDICATIONS GIVEN IN ED: Medications  acetaminophen (TYLENOL) tablet 1,000 mg (1,000 mg Oral Given 07/24/21 1858)     Consults:  None   EMR reviewed       FINAL CLINICAL IMPRESSION(S) / ED DIAGNOSES   Final diagnoses:  Fall, initial encounter  Musculoskeletal back pain     Rx / DC Orders   ED Discharge Orders          Ordered    acetaminophen (TYLENOL) 325 MG tablet  Every 6 hours PRN        07/24/21 2004             Note:  This document was prepared using Dragon voice recognition software and may include unintentional dictation errors.   Duffy Bruce, MD 07/24/21 2220

## 2021-07-24 NOTE — ED Triage Notes (Signed)
Pt reports she fell a few hours ago and walking down her hallway and hurt her right hip and back. Pt able to stand from car to wheelchair, reports pain is mostly in her lower back. Pt daughter reports when she fell she hit her life alert and they called her.

## 2021-07-24 NOTE — Discharge Instructions (Signed)
I'd recommend taking:  Tylenol (over-the-counter) every 4-6 hours for the next 2-3 days then as needed Ibuprofen 400 mg (Advil) every 8 hours for 2 days then as needed

## 2021-07-29 ENCOUNTER — Telehealth: Payer: Self-pay

## 2021-07-29 NOTE — Telephone Encounter (Signed)
Calling to [facility] of [facility] to see if they want to establish care per ER referral; both numbers on file are invalid

## 2023-06-07 ENCOUNTER — Other Ambulatory Visit: Payer: Self-pay

## 2023-06-07 ENCOUNTER — Emergency Department
Admission: EM | Admit: 2023-06-07 | Discharge: 2023-06-07 | Disposition: A | Discharge status: Home / Self Care | Attending: Emergency Medicine | Admitting: Emergency Medicine

## 2023-06-07 ENCOUNTER — Emergency Department

## 2023-06-07 ENCOUNTER — Encounter: Payer: Self-pay | Admitting: Emergency Medicine

## 2023-06-07 DIAGNOSIS — W19XXXA Unspecified fall, initial encounter: Secondary | ICD-10-CM | POA: Diagnosis not present

## 2023-06-07 DIAGNOSIS — R42 Dizziness and giddiness: Secondary | ICD-10-CM | POA: Diagnosis not present

## 2023-06-07 DIAGNOSIS — S0990XA Unspecified injury of head, initial encounter: Secondary | ICD-10-CM | POA: Diagnosis not present

## 2023-06-07 DIAGNOSIS — R5383 Other fatigue: Secondary | ICD-10-CM | POA: Diagnosis present

## 2023-06-07 DIAGNOSIS — M25511 Pain in right shoulder: Secondary | ICD-10-CM | POA: Diagnosis not present

## 2023-06-07 LAB — COMPREHENSIVE METABOLIC PANEL WITH GFR
ALT: 13 U/L (ref 0–44)
AST: 23 U/L (ref 15–41)
Albumin: 4.3 g/dL (ref 3.5–5.0)
Alkaline Phosphatase: 40 U/L (ref 38–126)
Anion gap: 7 (ref 5–15)
BUN: 15 mg/dL (ref 8–23)
CO2: 28 mmol/L (ref 22–32)
Calcium: 9.1 mg/dL (ref 8.9–10.3)
Chloride: 104 mmol/L (ref 98–111)
Creatinine, Ser: 0.55 mg/dL (ref 0.44–1.00)
GFR, Estimated: >60 mL/min (ref >60)
Glucose, Bld: 112 mg/dL — ABNORMAL HIGH (ref 70–99)
Potassium: 3.8 mmol/L (ref 3.5–5.1)
Sodium: 139 mmol/L (ref 135–145)
Total Bilirubin: 0.8 mg/dL (ref 0.0–1.2)
Total Protein: 7.2 g/dL (ref 6.5–8.1)

## 2023-06-07 LAB — URINALYSIS, ROUTINE W REFLEX MICROSCOPIC
Bilirubin Urine: NEGATIVE
Glucose, UA: NEGATIVE mg/dL
Hgb urine dipstick: NEGATIVE
Ketones, ur: NEGATIVE mg/dL
Leukocytes,Ua: NEGATIVE
Nitrite: NEGATIVE
Protein, ur: NEGATIVE mg/dL
Specific Gravity, Urine: 1.006 (ref 1.005–1.030)
pH: 8 (ref 5.0–8.0)

## 2023-06-07 LAB — RESP PANEL BY RT-PCR (RSV, FLU A&B, COVID)  RVPGX2
Influenza A by PCR: NEGATIVE
Influenza B by PCR: NEGATIVE
Resp Syncytial Virus by PCR: NEGATIVE
SARS Coronavirus 2 by RT PCR: NEGATIVE

## 2023-06-07 LAB — CBC
HCT: 41.7 % (ref 36.0–46.0)
Hemoglobin: 14.1 g/dL (ref 12.0–15.0)
MCH: 30.6 pg (ref 26.0–34.0)
MCHC: 33.8 g/dL (ref 30.0–36.0)
MCV: 90.5 fL (ref 80.0–100.0)
Platelets: 219 10*3/uL (ref 150–400)
RBC: 4.61 MIL/uL (ref 3.87–5.11)
RDW: 13.1 % (ref 11.5–15.5)
WBC: 5.3 10*3/uL (ref 4.0–10.5)
nRBC: 0 % (ref 0.0–0.2)

## 2023-06-07 LAB — TROPONIN I (HIGH SENSITIVITY): Troponin I (High Sensitivity): 6 ng/L (ref <18)

## 2023-06-07 NOTE — ED Triage Notes (Signed)
 Patient to ED via POV for dizziness. PT reports ongoing "for a long time." Patient alert to self, disoriented to place and time- baseline per daughter.. Daughter reports sleeping more than normal and that she has been c/o right shoulder pain. Also generalized weakness.

## 2023-06-07 NOTE — ED Provider Notes (Signed)
 [facility] Provider Note    Event Date/Time   First MD Initiated Contact with Patient 06/07/23 1808     (approximate)  History   Chief Complaint: Dizziness  HPI  Lynn  Vasquez is a 89 y.o. female presents to the emergency department with complaints of fatigue.  According to the family member and the patient she is typically very active however today she has felt very fatigued and has not gotten out of bed much.  Family member states for the last few days the patient has been complaining of increased dizziness.  Patient states the dizziness is a long-term issue and is not particularly worse currently.  Denies any recent fever cough or congestion.  States she has been experiencing pain in the right shoulder, worse with movement.  Saw her doctor for the same several days ago but he thought it was just shoulder pain and did not work it up any further.  Patient denies any chest pain.  Overall during my evaluation the patient appears well.  Answering all questions appropriately.  Has no active complaints.  Physical Exam   Triage Vital Signs: ED Triage Vitals  Encounter Vitals Group     BP 06/07/23 1740 136/83     Systolic BP Percentile --      Diastolic BP Percentile --      Pulse Rate 06/07/23 1740 71     Resp 06/07/23 1740 17     Temp 06/07/23 1740 97.9 F (36.6 C)     Temp Source 06/07/23 1740 Oral     SpO2 06/07/23 1740 97 %     Weight 06/07/23 1739 127 lb 13.9 oz (58 kg)     Height 06/07/23 1739 5\' 4"  (1.626 m)     Head Circumference --      Peak Flow --      Pain Score 06/07/23 1738 0     Pain Loc --      Pain Education --      Exclude from Growth Chart --     Most recent vital signs: Vitals:   06/07/23 1740  BP: 136/83  Pulse: 71  Resp: 17  Temp: 97.9 F (36.6 C)  SpO2: 97%    General: Awake, no distress.  CV:  Good peripheral perfusion.  Regular rate and rhythm  Resp:  Normal effort.  Equal breath sounds bilaterally.  Abd:  No  distention.  Soft, nontender.  No rebound or guarding. Other:  Equal grip strength bilaterally.  5/5 strength bilaterally.  No cranial nerve deficits.   ED Results / Procedures / Treatments   EKG  EKG viewed and interpreted by myself shows a normal sinus rhythm at 68 bpm with a narrow QRS, normal axis, normal intervals, no concerning ST changes.  RADIOLOGY  I have reviewed and interpreted the CT images.  No large bleed seen on my evaluation. Radiology is read the CT scan is negative for acute abnormality.   MEDICATIONS ORDERED IN ED: Medications - No data to display   IMPRESSION / MDM / ASSESSMENT AND PLAN / ED COURSE  I reviewed the triage vital signs and the nursing notes.  Patient's presentation is most consistent with acute presentation with potential threat to life or bodily function.  Patient presents to the emergency department with complaints of fatigue today.  Daughter states approximately 2 weeks ago patient did have a fall in which she hit her head and they were not evaluated given the patient's increased dizziness recently we will obtain  a CT scan of the head.  Patient states the dizziness is a chronic issue for her.  We will check labs including cardiac enzymes given the patient's right shoulder pain although it is reproducible with movement.  Patient has a great neurological exam no concern for CVA.  Given the patient's fatigue today we will also obtain a COVID swab as a precaution.  Patient's workup is reassuring.  Normal urinalysis, negative respiratory panel.  Patient's chemistry is reassuring.  CBC is reassuring.  Troponin is negative.  CT scan of the head shows no acute finding.  We will discharge patient home have her follow-up with her primary care doctor.  Patient and family member agreeable to plan.  FINAL CLINICAL IMPRESSION(S) / ED DIAGNOSES   Fatigue Dizziness  Note:  This document was prepared using Dragon voice recognition software and may include  unintentional dictation errors.   Ruth Cove, MD 06/07/23 2111

## 2023-10-04 ENCOUNTER — Other Ambulatory Visit: Payer: Self-pay

## 2023-10-04 ENCOUNTER — Emergency Department

## 2023-10-04 ENCOUNTER — Emergency Department
Admission: EM | Admit: 2023-10-04 | Discharge: 2023-10-04 | Disposition: A | Discharge status: Home / Self Care | Attending: Emergency Medicine | Admitting: Emergency Medicine

## 2023-10-04 DIAGNOSIS — R42 Dizziness and giddiness: Secondary | ICD-10-CM | POA: Diagnosis present

## 2023-10-04 DIAGNOSIS — S0990XA Unspecified injury of head, initial encounter: Secondary | ICD-10-CM | POA: Insufficient documentation

## 2023-10-04 DIAGNOSIS — W0110XA Fall on same level from slipping, tripping and stumbling with subsequent striking against unspecified object, initial encounter: Secondary | ICD-10-CM | POA: Insufficient documentation

## 2023-10-04 DIAGNOSIS — W19XXXA Unspecified fall, initial encounter: Secondary | ICD-10-CM

## 2023-10-04 LAB — CBC WITH DIFFERENTIAL/PLATELET
Abs Immature Granulocytes: 0.07 K/uL (ref 0.00–0.07)
Basophils Absolute: 0 K/uL (ref 0.0–0.1)
Basophils Relative: 1 %
Eosinophils Absolute: 0.1 K/uL (ref 0.0–0.5)
Eosinophils Relative: 3 %
HCT: 37.1 % (ref 36.0–46.0)
Hemoglobin: 12 g/dL (ref 12.0–15.0)
Immature Granulocytes: 1 %
Lymphocytes Relative: 26 %
Lymphs Abs: 1.4 K/uL (ref 0.7–4.0)
MCH: 30.2 pg (ref 26.0–34.0)
MCHC: 32.3 g/dL (ref 30.0–36.0)
MCV: 93.2 fL (ref 80.0–100.0)
Monocytes Absolute: 0.7 K/uL (ref 0.1–1.0)
Monocytes Relative: 12 %
Neutro Abs: 3 K/uL (ref 1.7–7.7)
Neutrophils Relative %: 57 %
Platelets: 168 K/uL (ref 150–400)
RBC: 3.98 MIL/uL (ref 3.87–5.11)
RDW: 13.5 % (ref 11.5–15.5)
WBC: 5.2 K/uL (ref 4.0–10.5)
nRBC: 0 % (ref 0.0–0.2)

## 2023-10-04 LAB — BASIC METABOLIC PANEL WITH GFR
Anion gap: 10 (ref 5–15)
BUN: 23 mg/dL (ref 8–23)
CO2: 25 mmol/L (ref 22–32)
Calcium: 8.6 mg/dL — ABNORMAL LOW (ref 8.9–10.3)
Chloride: 106 mmol/L (ref 98–111)
Creatinine, Ser: 0.65 mg/dL (ref 0.44–1.00)
GFR, Estimated: >60 mL/min (ref >60)
Glucose, Bld: 105 mg/dL — ABNORMAL HIGH (ref 70–99)
Potassium: 3.8 mmol/L (ref 3.5–5.1)
Sodium: 141 mmol/L (ref 135–145)

## 2023-10-04 LAB — URINALYSIS, ROUTINE W REFLEX MICROSCOPIC
Bacteria, UA: NONE SEEN
Bilirubin Urine: NEGATIVE
Glucose, UA: NEGATIVE mg/dL
Hgb urine dipstick: NEGATIVE
Ketones, ur: NEGATIVE mg/dL
Leukocytes,Ua: NEGATIVE
Nitrite: NEGATIVE
Protein, ur: 30 mg/dL — AB
Specific Gravity, Urine: 1.029 (ref 1.005–1.030)
pH: 5 (ref 5.0–8.0)

## 2023-10-04 LAB — CK: Total CK: 549 U/L — ABNORMAL HIGH (ref 38–234)

## 2023-10-04 MED ORDER — ACETAMINOPHEN 325 MG PO TABS
650.0000 mg | ORAL_TABLET | Freq: Once | ORAL | Status: AC
  Administered 2023-10-04: 650 mg via ORAL
  Filled 2023-10-04: qty 2

## 2023-10-04 MED ORDER — LIDOCAINE 5 % EX PTCH: 1.0000 | MEDICATED_PATCH | Freq: Two times a day (BID) | CUTANEOUS | 0 refills | Status: AC

## 2023-10-04 NOTE — Discharge Instructions (Signed)
 Your CT scans, blood work, EKG, and urine are overall reassuring today.  Please schedule follow-up appointments with ENT and neurology as we discussed.  Please return for any new, worsening, or changing symptoms or other concerns.  You may also use the Lidoderm  patches as prescribed.  It was a pleasure caring for you today.

## 2023-10-04 NOTE — ED Triage Notes (Signed)
 Fall on Monday morning and hit head.  No LOC Patient c/o feeling dizzy before the fall.  Patient is without complaint. C/O pain when touching area hit head.  AAOx3. Skin warm and dry. NAD  Patient has had two recent falls, the second, patient does not recall the fall and was found on the floor by daughter.

## 2023-10-04 NOTE — ED Provider Notes (Signed)
 [facility] Provider Note    Event Date/Time   First MD Initiated Contact with Patient 10/04/23 1203     (approximate)   History   Fall   HPI  Lynn  Vasquez is a 89 y.o. female who presents today for evaluation of fall.  Patient reports that for the past 1 month she has been feeling intermittently unsteady on her feet.  She reports that last night she got up to use the bathroom and tripped on something.  Patient reports that she fell back asleep on the ground, and is unsure how long she was on the ground for but estimates that it was a couple of hours.  Reports that her daughter found her this morning.  Patient reports that she has mild discomfort where she hit her head, though no pain elsewhere.  She denies feeling particularly weak.  She is not dizzy now.  She is not anticoagulated.  She is with her caregiver who reports that she is at her baseline mental status.  Patient Active Problem List   Diagnosis Date Noted   Dysphagia, unspecified 11/26/2017   Osteopenia 06/21/2017   Headache 04/24/2017   Family history of dementia 04/20/2017   Vitamin B12 deficiency 12/19/2016   Anemia 11/07/2016   Post-surgical hypothyroidism 11/07/2016   Hair loss 11/07/2016   Full code status 11/07/2016          Physical Exam   Triage Vital Signs: ED Triage Vitals  Encounter Vitals Group     BP 10/04/23 1119 (!) 122/59     Girls Systolic BP Percentile --      Girls Diastolic BP Percentile --      Boys Systolic BP Percentile --      Boys Diastolic BP Percentile --      Pulse --      Resp 10/04/23 1119 16     Temp 10/04/23 1119 97.7 F (36.5 C)     Temp Source 10/04/23 1119 Oral     SpO2 10/04/23 1119 100 %     Weight 10/04/23 1118 127 lb 13.9 oz (58 kg)     Height --      Head Circumference --      Peak Flow --      Pain Score 10/04/23 1118 0     Pain Loc --      Pain Education --      Exclude from Growth Chart --     Most recent vital signs: Vitals:    10/04/23 1200 10/04/23 1521  BP:  139/60  Pulse: 65 (!) 54  Resp:  16  Temp:    SpO2:  100%    Physical Exam Vitals and nursing note reviewed.  Constitutional:      General: Awake and alert. No acute distress.    Appearance: Normal appearance. The patient is normal weight.  HENT:     Head: Normocephalic.  Faint ecchymosis noted to the occiput without underlying hematoma.    Mouth: Mucous membranes are moist.  Eyes:     General: PERRL. Normal EOMs        Right eye: No discharge.        Left eye: No discharge.     Conjunctiva/sclera: Conjunctivae normal.  Cardiovascular:     Rate and Rhythm: Normal rate and regular rhythm.     Pulses: Normal pulses.  Pulmonary:     Effort: Pulmonary effort is normal. No respiratory distress.     Breath sounds: Normal breath sounds.  Abdominal:     Abdomen is soft. There is no abdominal tenderness. No rebound or guarding. No distention. Musculoskeletal:        General: No swelling. Normal range of motion.     Cervical back: Normal range of motion and neck supple.  Skin:    General: Skin is warm and dry.     Capillary Refill: Capillary refill takes less than 2 seconds.     Findings: No rash.  Neurological:     Mental Status: The patient is awake and alert.   Neurological: GCS 15 alert and oriented x3 Normal speech, no expressive or receptive aphasia or dysarthria Cranial nerves II through XII intact Normal visual fields 5 out of 5 strength in all 4 extremities with intact sensation throughout No extremity drift Normal finger-to-nose testing, no limb or truncal ataxia   ED Results / Procedures / Treatments   Labs (all labs ordered are listed, but only abnormal results are displayed) Labs Reviewed  BASIC METABOLIC PANEL WITH GFR - Abnormal; Notable for the following components:      Result Value   Glucose, Bld 105 (*)    Calcium 8.6 (*)    All other components within normal limits  URINALYSIS, ROUTINE W REFLEX MICROSCOPIC -  Abnormal; Notable for the following components:   Color, Urine YELLOW (*)    APPearance HAZY (*)    Protein, ur 30 (*)    All other components within normal limits  CK - Abnormal; Notable for the following components:   Total CK 549 (*)    All other components within normal limits  CBC WITH DIFFERENTIAL/PLATELET     EKG     RADIOLOGY I independently reviewed and interpreted imaging and agree with radiologists findings.     PROCEDURES:  Critical Care performed:   Procedures   MEDICATIONS ORDERED IN ED: Medications  acetaminophen  (TYLENOL ) tablet 650 mg (650 mg Oral Given 10/04/23 1409)     IMPRESSION / MDM / ASSESSMENT AND PLAN / ED COURSE  I reviewed the triage vital signs and the nursing notes.   Differential diagnosis includes, but is not limited to, mechanical fall, contusion, hematoma, intracranial hemorrhage, UTI, dehydration, electrolyte disarray.  Patient is awake and alert, hemodynamically stable and afebrile.  She is nontoxic in appearance.  Further workup is indicated.  Labs obtained as well as EKG and urinalysis.  CT head and neck obtained in triage per [place] criteria is negative for any acute findings.  Labs are also overall reassuring.  She has a mild elevation in her CPK, though normal renal function.  Urinalysis is negative for any acute findings, no evidence of UTI, no myoglobin.  Patient is alert and at her mental baseline.  She reports that she is not dizzy.  She lives next-door to her daughter and feels safe going home.  EKG is nonischemic, also reviewed by attending MD.  Advise follow-up with ENT/neurology if her dizziness persists, though she is not currently dizzy.  Caretaker Haddy at bedside as well as her daughter all feel comfortable with discharge home at this time.  Patient is ambulatory with a steady gait and denies any pain.  We discussed return precautions.  Patient and family understand and agree with plan.  She was discharged in stable  condition.   Patient's presentation is most consistent with acute presentation with potential threat to life or bodily function.    FINAL CLINICAL IMPRESSION(S) / ED DIAGNOSES   Final diagnoses:  Fall, initial encounter  Injury of head,  initial encounter     Rx / DC Orders   ED Discharge Orders          Ordered    lidocaine  (LIDODERM ) 5 %  Every 12 hours        10/04/23 1457             Note:  This document was prepared using Dragon voice recognition software and may include unintentional dictation errors.   Daisa Stennis E, PA-C 10/04/23 1638    Suzanne Kirsch, MD 10/13/23 [phone]

## 2023-10-19 ENCOUNTER — Ambulatory Visit: Attending: Family Medicine | Admitting: Speech Pathology

## 2023-10-19 DIAGNOSIS — R1310 Dysphagia, unspecified: Secondary | ICD-10-CM | POA: Insufficient documentation

## 2023-10-19 NOTE — Therapy (Signed)
 OUTPATIENT SPEECH LANGUAGE PATHOLOGY  SWALLOW EVALUATION   Patient Name: Lynn Vasquez MRN: [id number] DOB:[birth year], 89 y.o., female 86 Date: 10/19/2023  PCP: Alm Na, MD REFERRING PROVIDER: Alm Na, MD   End of Session - 10/19/23 1144     Visit Number 1    Number of Visits 1    SLP Start Time 1100    SLP Stop Time  1135    SLP Time Calculation (min) 35 min    Activity Tolerance Patient tolerated treatment well          Past Medical History:  Diagnosis Date   Full code status 11/07/2016   Discussed November 07, 2016; do try resuscitative measures if needed; however, patient does not wish to be maintained on life support if terminably incurable or cancer   History of skin cancer    Pinched nerve    Pneumonia    Vertigo    Past Surgical History:  Procedure Laterality Date   BREAST LUMPECTOMY Right    1954- benign   BREAST SURGERY Right 8031,8045   CATARACT EXTRACTION, BILATERAL Bilateral 2013-2014   CHOLECYSTECTOMY     FINGER SURGERY Right    removed spur   goiter surgery     HAND SURGERY     SKIN CANCER EXCISION Right    leg   TOE SURGERY Right    removed bone in little toe   TONSILLECTOMY     VARICOSE VEIN SURGERY Right    Patient Active Problem List   Diagnosis Date Noted   Dysphagia, unspecified 11/26/2017   Osteopenia 06/21/2017   Headache 04/24/2017   Family history of dementia 04/20/2017   Vitamin B12 deficiency 12/19/2016   Anemia 11/07/2016   Post-surgical hypothyroidism 11/07/2016   Hair loss 11/07/2016   Full code status 11/07/2016    ONSET DATE: date of referral  10/11/2023  REFERRING DIAG: R13.11 (ICD-10-CM) - Oral phase dysphagia   THERAPY DIAG:  Dysphagia, unspecified type  Rationale for Evaluation and Treatment Rehabilitation  SUBJECTIVE:   SUBJECTIVE STATEMENT: Pt pleasant, fair historian, daughter aides in supplementing information Pt accompanied by: self and family member  PERTINENT HISTORY :  Lynn  Vasquez is a 89 y.o. female in today for ER follow-up. She fell in her home on 10/04/23. Her daughter reports finding her on the floor, and had hit her head on the backboard of the bed. Reason for fall was unclear. Imaging and labs in the ER were essentially negative. She has had worsening memory issues for over a year and she has had multiple falls in the past year or so. She has had episodes of dizziness in the past 18 months and daughter notes she is not steady on her feet. She has an aide who stays with her 8-5 and family has now added someone to stay with her overnight. She is on lexapro 5 mg daily. Daughter feels it helps with her mood.  MBSS 11/23/2017 Pt appears to present w/ adequate oropharyngeal phase swallowing function w/ no oropharyngeal dysphagia noted during this exam. However, during the Esophageal phase, Esophageal dysmotility was noted during the presentation of the Barium tablet as well as during bolus motility w/ a Puree trial (occurring in the lower Cervical Esophagus). Pt utilized strategies of Time and Alternating w/ sips of liquid to help wash or clear the Barium tablet of the Mid and Distal Esophagus. The Barium tablet was noted to move slowly requiring multiple sips of liquid before eventual clearing occurred. Suspect this presentation could be related to pt's  c/o difficulty when swallowing Large Pills and Solids at home -      PAIN:  Are you having pain? No  FALLS: Has patient fallen in last 6 months?  Yes  LIVING ENVIRONMENT: Lives with: paid caregivers Lives in: House/apartment  PLOF:  Level of assistance: Needed assistance with ADLs, Needed assistance with IADLS Employment: Retired   PATIENT GOALS  swallow assessment  OBJECTIVE:  RECOMMENDATIONS FROM OBJECTIVE SWALLOW STUDY (MBSS/FEES):   MBSS 11/23/2017 Pt appears to present w/ adequate oropharyngeal phase swallowing function w/ no oropharyngeal dysphagia noted during this exam. However, during the  Esophageal phase, Esophageal dysmotility was noted during the presentation of the Barium tablet as well as during bolus motility w/ a Puree trial (occurring in the lower Cervical Esophagus). Pt utilized strategies of Time and Alternating w/ sips of liquid to help wash or clear the Barium tablet of the Mid and Distal Esophagus. The Barium tablet was noted to move slowly requiring multiple sips of liquid before eventual clearing occurred. Suspect this presentation could be related to pt's c/o difficulty when swallowing Large Pills and Solids at home -    COGNITION: Overall cognitive status: Impaired Areas of impairment:  Functional deficits: has paid caregivers, pending referral to neurology  ORAL MOTOR EXAMINATION Facial : WFL Lingual: WFL Velum: WFL Mandible: WFL Cough: WFL Voice: WFL   CLINICAL SWALLOW ASSESSMENT:   Current diet: regular and thin liquids Feeding: able to feed self Consistencies tested: Thin Liquid: Presentation: Cup, Straw, and Self-fed Oral Phase: WFL Pharyngeal Phase: WFL Puree: Presentation: Spoon and Self-fed Oral Phase: WFL Pharyngeal Phase: WFL Regular: Presentation: By hand and Self-fed Oral Phase: WFL Pharyngeal Phase: WFL   Evaluation findings: Patient presents with oropharyngeal swallow which appears clinically to be within functional limits with adequate airway protection. Oral stage is characterized by appearance of adequate oral containment, mastication, bolus formation, oral transfer and oral clearance. Swallow initiation appears timely. No overt signs of aspiration observed despite challenging with consecutive straw sips of thin liquids in excess of 3oz.   Overall aspiration risk:No limitations and Mild Diet Recommendations: regular and thin liquids Precautions:Minimize environmental distractions, Slow rate, Small sips/bites, Seated upright 90 degrees, and Remain upright for at least 30 minutes after meals Supervision: Patient able to feed  self Oral care recommendations:Oral care BID Follow-up recommendations: No treatment recommended    TODAY'S TREATMENT:  Skilled written and verbal information provided on general aspiration and reflux precautions.    PATIENT EDUCATION: Education details: as above Person educated: Patient and Child(ren) Education method: Explanation Education comprehension: verbalized understanding  HOME EXERCISE PROGRAM:     Follow general aspiration and reflux precautions   ASSESSMENT:  CLINICAL IMPRESSION: Patient is a 89 y.o. female who was seen today for a clinical swallow evaluation d/t report of globus sensation with chicken, dry bread and some coughing at night while sleeping.   At this time, pt presented with adequate oropharyngeal abilities when consuming thin liquids via cup and straw, applesauce and graham crackers with peanut butter. Pt with evidence of complete oral phase and the appearance of swift pharyngeal response. Pt reports some things are difficult to swallow and they report that bread that is maybe a little dry, water is fine. Pt and her daughter also report difficulty trying to get food to go down not coughing, respiratory compromise just uncomfortable but not distressful.   PLAN: If symptoms of globus sensation worsen, recommend requesting GI consult. If pt develops coughing, wetness with PO intake, recommend pt's daughter contact  this Clinical research associate (direct office number provided).     Krystan Northrop B. Rubbie, M.S., CCC-SLP, CBIS Speech-Language Pathologist Certified Brain Injury Specialist [facility]  [facility] [phone] Ascom [phone] Fax [phone]    '
